# Patient Record
Sex: Female | Born: 1985 | Race: Black or African American | Hispanic: No | Marital: Single | Smoking: Never smoker
Health system: Southern US, Community
[De-identification: ages and names within clinical notes are randomized; demographics above are authoritative.]

## PROBLEM LIST (undated history)

## (undated) ENCOUNTER — Inpatient Hospital Stay (HOSPITAL_COMMUNITY): Payer: Self-pay

## (undated) DIAGNOSIS — E669 Obesity, unspecified: Secondary | ICD-10-CM

## (undated) DIAGNOSIS — I10 Essential (primary) hypertension: Secondary | ICD-10-CM

## (undated) HISTORY — PX: NO PAST SURGERIES: SHX2092

---

## 2009-07-18 ENCOUNTER — Ambulatory Visit: Payer: Self-pay | Admitting: Obstetrics and Gynecology

## 2009-07-18 ENCOUNTER — Inpatient Hospital Stay (HOSPITAL_COMMUNITY): Admission: AD | Admit: 2009-07-18 | Discharge: 2009-07-18 | Payer: Self-pay | Admitting: Obstetrics and Gynecology

## 2009-07-25 ENCOUNTER — Ambulatory Visit: Payer: Self-pay | Admitting: Obstetrics & Gynecology

## 2009-07-27 ENCOUNTER — Encounter: Payer: Self-pay | Admitting: Obstetrics and Gynecology

## 2009-07-27 ENCOUNTER — Ambulatory Visit: Payer: Self-pay | Admitting: Obstetrics & Gynecology

## 2009-07-27 LAB — CONVERTED CEMR LAB: Antibody Screen: NEGATIVE

## 2009-07-31 ENCOUNTER — Ambulatory Visit: Payer: Self-pay | Admitting: Obstetrics & Gynecology

## 2009-08-01 ENCOUNTER — Inpatient Hospital Stay (HOSPITAL_COMMUNITY): Admission: AD | Admit: 2009-08-01 | Discharge: 2009-08-03 | Payer: Self-pay | Admitting: Obstetrics & Gynecology

## 2009-08-01 ENCOUNTER — Ambulatory Visit: Payer: Self-pay | Admitting: Advanced Practice Midwife

## 2009-09-15 ENCOUNTER — Emergency Department (HOSPITAL_BASED_OUTPATIENT_CLINIC_OR_DEPARTMENT_OTHER): Admission: EM | Admit: 2009-09-15 | Discharge: 2009-09-15 | Payer: Self-pay | Admitting: Emergency Medicine

## 2010-11-11 ENCOUNTER — Encounter: Payer: Self-pay | Admitting: *Deleted

## 2011-01-24 LAB — CBC
HCT: 32.2 % — ABNORMAL LOW (ref 36.0–46.0)
Hemoglobin: 10.3 g/dL — ABNORMAL LOW (ref 12.0–15.0)
MCV: 79.4 fL (ref 78.0–100.0)
Platelets: 258 10*3/uL (ref 150–400)
RDW: 16.9 % — ABNORMAL HIGH (ref 11.5–15.5)
WBC: 6.6 10*3/uL (ref 4.0–10.5)

## 2011-01-24 LAB — RPR: RPR Ser Ql: NONREACTIVE

## 2011-01-24 LAB — POCT URINALYSIS DIP (DEVICE)
Glucose, UA: NEGATIVE mg/dL
Specific Gravity, Urine: 1.02 (ref 1.005–1.030)
Urobilinogen, UA: >=8 mg/dL (ref 0.0–1.0)

## 2011-01-25 LAB — DIFFERENTIAL
Basophils Absolute: 0.1 10*3/uL (ref 0.0–0.1)
Eosinophils Relative: 2 % (ref 0–5)
Lymphocytes Relative: 19 % (ref 12–46)
Monocytes Absolute: 0.5 10*3/uL (ref 0.1–1.0)
Monocytes Relative: 8 % (ref 3–12)
Neutro Abs: 4.2 10*3/uL (ref 1.7–7.7)

## 2011-01-25 LAB — CBC
HCT: 29.8 % — ABNORMAL LOW (ref 36.0–46.0)
Hemoglobin: 9.8 g/dL — ABNORMAL LOW (ref 12.0–15.0)
RBC: 3.73 MIL/uL — ABNORMAL LOW (ref 3.87–5.11)
RDW: 16.2 % — ABNORMAL HIGH (ref 11.5–15.5)

## 2011-01-25 LAB — URINE MICROSCOPIC-ADD ON

## 2011-01-25 LAB — URINALYSIS, ROUTINE W REFLEX MICROSCOPIC
Glucose, UA: NEGATIVE mg/dL
Hgb urine dipstick: NEGATIVE
Specific Gravity, Urine: 1.02 (ref 1.005–1.030)
pH: 7 (ref 5.0–8.0)

## 2011-01-25 LAB — RAPID URINE DRUG SCREEN, HOSP PERFORMED
Cocaine: NOT DETECTED
Opiates: NOT DETECTED
Tetrahydrocannabinol: NOT DETECTED

## 2011-01-25 LAB — RUBELLA SCREEN: Rubella: 23.1 IU/mL — ABNORMAL HIGH

## 2011-01-25 LAB — STREP B DNA PROBE: Strep Group B Ag: POSITIVE

## 2011-01-25 LAB — GC/CHLAMYDIA PROBE AMP, GENITAL
Chlamydia, DNA Probe: NEGATIVE
GC Probe Amp, Genital: NEGATIVE

## 2011-01-25 LAB — ABO/RH: ABO/RH(D): A POS

## 2011-01-25 LAB — GLUCOSE, CAPILLARY: Glucose-Capillary: 82 mg/dL (ref 70–99)

## 2011-01-25 LAB — RPR: RPR Ser Ql: NONREACTIVE

## 2011-01-25 LAB — SICKLE CELL SCREEN: Sickle Cell Screen: NEGATIVE

## 2011-01-25 LAB — WET PREP, GENITAL

## 2011-05-15 ENCOUNTER — Emergency Department (HOSPITAL_BASED_OUTPATIENT_CLINIC_OR_DEPARTMENT_OTHER)
Admission: EM | Admit: 2011-05-15 | Discharge: 2011-05-15 | Disposition: A | Discharge status: Home / Self Care | Payer: Self-pay | Attending: Emergency Medicine | Admitting: Emergency Medicine

## 2011-05-15 ENCOUNTER — Encounter: Payer: Self-pay | Admitting: *Deleted

## 2011-05-15 DIAGNOSIS — T63461A Toxic effect of venom of wasps, accidental (unintentional), initial encounter: Secondary | ICD-10-CM | POA: Insufficient documentation

## 2011-05-15 DIAGNOSIS — R21 Rash and other nonspecific skin eruption: Secondary | ICD-10-CM | POA: Insufficient documentation

## 2011-05-15 DIAGNOSIS — T6391XA Toxic effect of contact with unspecified venomous animal, accidental (unintentional), initial encounter: Secondary | ICD-10-CM | POA: Insufficient documentation

## 2011-05-15 DIAGNOSIS — T148 Other injury of unspecified body region: Secondary | ICD-10-CM

## 2011-05-15 MED ORDER — FAMOTIDINE 20 MG PO TABS
20.0000 mg | ORAL_TABLET | Freq: Once | ORAL | Status: AC
  Administered 2011-05-15: 20 mg via ORAL
  Filled 2011-05-15: qty 1

## 2011-05-15 MED ORDER — PREDNISONE 20 MG PO TABS
50.0000 mg | ORAL_TABLET | Freq: Once | ORAL | Status: AC
  Administered 2011-05-15: 50 mg via ORAL
  Filled 2011-05-15: qty 2

## 2011-05-15 MED ORDER — DIPHENHYDRAMINE HCL 25 MG PO CAPS: 25.0000 mg | ORAL_CAPSULE | Freq: Four times a day (QID) | ORAL | Status: AC | PRN

## 2011-05-15 MED ORDER — DIPHENHYDRAMINE HCL 25 MG PO CAPS
25.0000 mg | ORAL_CAPSULE | Freq: Once | ORAL | Status: AC
  Administered 2011-05-15: 25 mg via ORAL
  Filled 2011-05-15 (×2): qty 1

## 2011-05-15 NOTE — ED Notes (Signed)
Pt thinks she may have been bitten by a bug. Started as a little bump left of the umbilicus on Monday morning. Has now progressed to redness surrounding the umbilicus. Denies any meds PTA

## 2011-05-15 NOTE — ED Provider Notes (Signed)
History   occurred in area of her naval, did not see an insect but felt a sting, did not notice a retained stinger, now has increased redness and swelling, no SOB or lip swelling or chest tightness. No tick exposure or injury. No known allergens otherwise.   Chief Complaint  Patient presents with  . Insect Bite   Patient is a 25 y.o. female presenting with hypersensitivity reaction. The history is provided by the patient.  Hypersensitivity Reaction The primary symptoms are  rash. The primary symptoms do not include wheezing, shortness of breath, cough, abdominal pain, vomiting, palpitations or angioedema. The current episode started yesterday. The problem has been gradually worsening.  The rash is associated with itching.   The onset of the reaction was associated with insect bite/sting. Significant symptoms also include itching. Significant symptoms that are not present include eye redness or rhinorrhea.    History reviewed. No pertinent past medical history.  History reviewed. No pertinent past surgical history.  History reviewed. No pertinent family history.  History  Substance Use Topics  . Smoking status: Never Smoker   . Smokeless tobacco: Not on file  . Alcohol Use: No    OB History    Grav Para Term Preterm Abortions TAB SAB Ect Mult Living                  Review of Systems  Constitutional: Negative for fever and chills.  HENT: Negative for rhinorrhea, neck pain and neck stiffness.   Eyes: Negative for pain and redness.  Respiratory: Negative for cough, shortness of breath and wheezing.   Cardiovascular: Negative for chest pain and palpitations.  Gastrointestinal: Negative for vomiting and abdominal pain.  Genitourinary: Negative for dysuria.  Musculoskeletal: Negative for back pain.  Skin: Positive for itching and rash.  Neurological: Negative for headaches.  All other systems reviewed and are negative.    Physical Exam  BP 133/84  Pulse 87  Temp(Src)  97.7 F (36.5 C) (Oral)  Resp 18  Ht 5\' 6"  (1.676 m)  Wt 320 lb (145.151 kg)  BMI 51.65 kg/m2  SpO2 100%  Physical Exam  Constitutional: She is oriented to person, place, and time. She appears well-developed and well-nourished.  HENT:  Head: Normocephalic and atraumatic.  Eyes: Conjunctivae and EOM are normal. Pupils are equal, round, and reactive to light.  Neck: Trachea normal. Neck supple. No thyromegaly present.  Cardiovascular: Normal rate, regular rhythm, S1 normal, S2 normal and normal pulses.     No systolic murmur is present   No diastolic murmur is present  Pulses:      Radial pulses are 2+ on the right side, and 2+ on the left side.  Pulmonary/Chest: Effort normal and breath sounds normal. She has no wheezes. She has no rhonchi. She has no rales. She exhibits no tenderness.  Abdominal: Soft. Normal appearance and bowel sounds are normal. There is no tenderness. There is no CVA tenderness and negative Murphy's sign.  Musculoskeletal:       BLE:s Calves nontender, no cords or erythema, negative Homans sign  Neurological: She is alert and oriented to person, place, and time. She has normal strength. No cranial nerve deficit or sensory deficit. GCS eye subscore is 4. GCS verbal subscore is 5. GCS motor subscore is 6.  Skin: Skin is warm and dry. Rash noted. She is not diaphoretic.       periumbilcal mostly L sided erythema, edema and inc warmth, blanching rash c/w insect envenomation and localized reaction  Psychiatric: Her speech is normal.       Cooperative and appropriate    ED Course  Procedures  MDM Exam c/w reported insect envenomation, no symptoms of anaphylaxis, no stridor or wheezing, uvula midline. Treated benadryl, PO pred and pecid here with strict return precautions verbalized as understood.       Sunnie Nielsen, MD 05/15/11 [phone]

## 2012-04-28 ENCOUNTER — Emergency Department (HOSPITAL_COMMUNITY)
Admission: EM | Admit: 2012-04-28 | Discharge: 2012-04-28 | Discharge status: Against Medical Advice | Payer: Medicaid Other | Attending: Emergency Medicine | Admitting: Emergency Medicine

## 2012-04-28 ENCOUNTER — Encounter (HOSPITAL_COMMUNITY): Payer: Self-pay | Admitting: *Deleted

## 2012-04-28 DIAGNOSIS — R3989 Other symptoms and signs involving the genitourinary system: Secondary | ICD-10-CM | POA: Insufficient documentation

## 2012-04-28 DIAGNOSIS — R109 Unspecified abdominal pain: Secondary | ICD-10-CM | POA: Insufficient documentation

## 2012-04-28 LAB — URINE MICROSCOPIC-ADD ON

## 2012-04-28 LAB — CBC WITH DIFFERENTIAL/PLATELET
Basophils Absolute: 0 10*3/uL (ref 0.0–0.1)
Basophils Relative: 0 % (ref 0–1)
Eosinophils Absolute: 0.1 10*3/uL (ref 0.0–0.7)
Hemoglobin: 12.2 g/dL (ref 12.0–15.0)
MCH: 25.9 pg — ABNORMAL LOW (ref 26.0–34.0)
MCHC: 32.3 g/dL (ref 30.0–36.0)
Monocytes Relative: 5 % (ref 3–12)
Neutro Abs: 4.6 10*3/uL (ref 1.7–7.7)
Neutrophils Relative %: 72 % (ref 43–77)
Platelets: 305 10*3/uL (ref 150–400)
RDW: 14.2 % (ref 11.5–15.5)

## 2012-04-28 LAB — URINALYSIS, ROUTINE W REFLEX MICROSCOPIC
Ketones, ur: NEGATIVE mg/dL
Nitrite: NEGATIVE
Protein, ur: NEGATIVE mg/dL
Urobilinogen, UA: 1 mg/dL (ref 0.0–1.0)

## 2012-04-28 LAB — BASIC METABOLIC PANEL
BUN: 9 mg/dL (ref 6–23)
Creatinine, Ser: 0.73 mg/dL (ref 0.50–1.10)
GFR calc Af Amer: >90 mL/min (ref >90)
GFR calc non Af Amer: >90 mL/min (ref >90)
Potassium: 3.6 mEq/L (ref 3.5–5.1)

## 2012-04-28 LAB — PREGNANCY, URINE: Preg Test, Ur: NEGATIVE

## 2012-04-28 NOTE — ED Notes (Signed)
Pt c/o right flank pain x 2 days; "sinus infection" also; no dysuria

## 2012-04-28 NOTE — ED Provider Notes (Signed)
History     CSN: [id number]  Arrival date & time 04/28/12  7829   First MD Initiated Contact with Patient 04/28/12 2151      Chief Complaint  Patient presents with  . Flank Pain    (Consider location/radiation/quality/duration/timing/severity/associated sxs/prior treatment) HPI Comments: R flank pain without dysuria also concerned she may be pregnant   Patient is a 26 y.o. female presenting with flank pain. The history is provided by the patient.  Flank Pain This is a new problem. The current episode started yesterday. The problem has been unchanged. Associated symptoms include urinary symptoms. Pertinent negatives include no abdominal pain, fatigue, fever, nausea, rash or vomiting. She has tried nothing for the symptoms.    History reviewed. No pertinent past medical history.  History reviewed. No pertinent past surgical history.  No family history on file.  History  Substance Use Topics  . Smoking status: Never Smoker   . Smokeless tobacco: Not on file  . Alcohol Use: No    OB History    Grav Para Term Preterm Abortions TAB SAB Ect Mult Living                  Review of Systems  Constitutional: Negative for fever and fatigue.  Gastrointestinal: Negative for nausea, vomiting and abdominal pain.  Genitourinary: Positive for flank pain. Negative for dysuria, vaginal bleeding, vaginal discharge and menstrual problem.  Skin: Negative for rash.    Allergies  Review of patient's allergies indicates no known allergies.  Home Medications  No current outpatient prescriptions on file.  BP 123/56  Pulse 100  Temp 98.5 F (36.9 C)  Resp 20  SpO2 100%  LMP 04/14/2012  Physical Exam  Constitutional: She appears well-developed.  HENT:  Head: Normocephalic.  Eyes: Pupils are equal, round, and reactive to light.  Neck: Normal range of motion.  Cardiovascular: Normal rate.   Pulmonary/Chest: Effort normal.  Abdominal: She exhibits no distension. There is no  tenderness.  Musculoskeletal: Normal range of motion.  Neurological: She is alert.  Skin: No rash noted.    ED Course  Procedures (including critical care time)  Labs Reviewed  URINALYSIS, ROUTINE W REFLEX MICROSCOPIC - Abnormal; Notable for the following:    APPearance CLOUDY (*)     Leukocytes, UA MODERATE (*)     All other components within normal limits  CBC WITH DIFFERENTIAL - Abnormal; Notable for the following:    MCH 25.9 (*)     All other components within normal limits  URINE MICROSCOPIC-ADD ON - Abnormal; Notable for the following:    Squamous Epithelial / LPF MANY (*)     Bacteria, UA MANY (*)     All other components within normal limits  PREGNANCY, URINE  BASIC METABOLIC PANEL   No results found.   1. Flank pain       MDM  + leukocytes and Bacteria without nitrates  Denies vaginal discharge  Discussed need for pelvic exam after review of urine sample patient refuses  Informed her of negative pregnancy test Patient left department before she could be discharged         Arman Filter, NP 04/28/12 2231  Arman Filter, NP 04/28/12 2231

## 2012-04-29 NOTE — ED Provider Notes (Signed)
Medical screening examination/treatment/procedure(s) were performed by non-physician practitioner and as supervising physician I was immediately available for consultation/collaboration.   Betsaida [place], MD 04/29/12 0020 

## 2013-01-16 ENCOUNTER — Inpatient Hospital Stay (HOSPITAL_COMMUNITY)
Admission: AD | Admit: 2013-01-16 | Discharge: 2013-01-16 | Disposition: A | Discharge status: Home / Self Care | Payer: Self-pay | Source: Ambulatory Visit | Attending: Obstetrics & Gynecology | Admitting: Obstetrics & Gynecology

## 2013-01-16 ENCOUNTER — Encounter (HOSPITAL_COMMUNITY): Payer: Self-pay

## 2013-01-16 ENCOUNTER — Inpatient Hospital Stay (HOSPITAL_COMMUNITY): Payer: Self-pay

## 2013-01-16 DIAGNOSIS — O99891 Other specified diseases and conditions complicating pregnancy: Secondary | ICD-10-CM | POA: Insufficient documentation

## 2013-01-16 DIAGNOSIS — Z1389 Encounter for screening for other disorder: Secondary | ICD-10-CM

## 2013-01-16 DIAGNOSIS — Z3490 Encounter for supervision of normal pregnancy, unspecified, unspecified trimester: Secondary | ICD-10-CM

## 2013-01-16 DIAGNOSIS — R109 Unspecified abdominal pain: Secondary | ICD-10-CM | POA: Insufficient documentation

## 2013-01-16 LAB — URINALYSIS, ROUTINE W REFLEX MICROSCOPIC
Bilirubin Urine: NEGATIVE
Glucose, UA: NEGATIVE mg/dL
Hgb urine dipstick: NEGATIVE
Specific Gravity, Urine: 1.015 (ref 1.005–1.030)

## 2013-01-16 LAB — CBC
HCT: 35 % — ABNORMAL LOW (ref 36.0–46.0)
Hemoglobin: 11.2 g/dL — ABNORMAL LOW (ref 12.0–15.0)
MCH: 26.4 pg (ref 26.0–34.0)
MCV: 82.5 fL (ref 78.0–100.0)
RBC: 4.24 MIL/uL (ref 3.87–5.11)
WBC: 7.4 10*3/uL (ref 4.0–10.5)

## 2013-01-16 MED ORDER — PRENATAL VITAMINS PLUS 27-1 MG PO TABS: 1.0000 | ORAL_TABLET | Freq: Every day | ORAL | Status: DC

## 2013-01-16 NOTE — MAU Note (Signed)
LMP 12/05/12. Positive HPT. Lower abdominal cramping x 3 days. Denies vaginal bleeding or vaginal discharge.

## 2013-01-16 NOTE — MAU Provider Note (Signed)
History     CSN: [id number]  Arrival date and time: 01/16/13 2100   First Provider Initiated Contact with Patient 01/16/13 2309      No chief complaint on file.  HPI Brooke Henderson 27 y.o. [redacted]w[redacted]d   Comes to MAU with lower abdominal pain.  No bleeding.  No discharge.  No nausea or vomiting.  OB History   Grav Para Term Preterm Abortions TAB SAB Ect Mult Living   5 3 3  0 0 0 0 0 0 3      No past medical history on file.  No past surgical history on file.  No family history on file.  History  Substance Use Topics  . Smoking status: Never Smoker   . Smokeless tobacco: Not on file  . Alcohol Use: No    Allergies: No Known Allergies  No prescriptions prior to admission    Review of Systems  Constitutional: Negative for fever.  Gastrointestinal: Positive for abdominal pain. Negative for nausea, vomiting, diarrhea and constipation.  Genitourinary:       No vaginal discharge. No vaginal bleeding. No dysuria.   Physical Exam   Blood pressure 133/96, pulse 82, temperature 98.3 F (36.8 C), temperature source Oral, resp. rate 18, height 5\' 6"  (1.676 m), weight 339 lb 3.2 oz (153.86 kg), last menstrual period 12/05/2012, SpO2 100.00%.  Physical Exam  Nursing note and vitals reviewed. Constitutional: She is oriented to person, place, and time. She appears well-developed and well-nourished. No distress.  Morbid obesity  HENT:  Head: Normocephalic.  Eyes: EOM are normal.  Neck: Neck supple.  Musculoskeletal: Normal range of motion.  Neurological: She is alert and oriented to person, place, and time.  Skin: Skin is warm and dry.  Psychiatric: She has a normal mood and affect.    MAU Course  Procedures Results for orders placed during the hospital encounter of 01/16/13 (from the past 24 hour(s))  URINALYSIS, ROUTINE W REFLEX MICROSCOPIC     Status: None   Collection Time    01/16/13  9:24 PM      Result Value Range   Color, Urine YELLOW  YELLOW   APPearance CLEAR   CLEAR   Specific Gravity, Urine 1.015  1.005 - 1.030   pH 8.0  5.0 - 8.0   Glucose, UA NEGATIVE  NEGATIVE mg/dL   Hgb urine dipstick NEGATIVE  NEGATIVE   Bilirubin Urine NEGATIVE  NEGATIVE   Ketones, ur NEGATIVE  NEGATIVE mg/dL   Protein, ur NEGATIVE  NEGATIVE mg/dL   Urobilinogen, UA 0.2  0.0 - 1.0 mg/dL   Nitrite NEGATIVE  NEGATIVE   Leukocytes, UA NEGATIVE  NEGATIVE  POCT PREGNANCY, URINE     Status: Abnormal   Collection Time    01/16/13  9:31 PM      Result Value Range   Preg Test, Ur POSITIVE (*) NEGATIVE  CBC     Status: Abnormal   Collection Time    01/16/13  9:47 PM      Result Value Range   WBC 7.4  4.0 - 10.5 K/uL   RBC 4.24  3.87 - 5.11 MIL/uL   Hemoglobin 11.2 (*) 12.0 - 15.0 g/dL   HCT 78.2 (*) 95.6 - 21.3 %   MCV 82.5  78.0 - 100.0 fL   MCH 26.4  26.0 - 34.0 pg   MCHC 32.0  30.0 - 36.0 g/dL   RDW 08.6  57.8 - 46.9 %   Platelets 248  150 - 400 K/uL  HCG,  QUANTITATIVE, PREGNANCY     Status: Abnormal   Collection Time    01/16/13  9:47 PM      Result Value Range   hCG, Beta Chain, Quant, S 5142 (*) <5 mIU/mL   MDM Ultrasound worksheet shows IUGS with yolk sac at 5w 4d with EDC of 09-14-13.  Assessment and Plan  IUP  Plan  Begin prenatal care. Plans to call clinic downstairs.  Phone number given.  No smoking, no drugs, no alcohol. rx for prenatal vitamins given Verification of pregnancy given.  Brooke Henderson 01/16/2013, 11:14 PM

## 2013-01-17 NOTE — MAU Provider Note (Signed)
Attestation of Attending Supervision of Advanced Practitioner (PA/CNM/NP): Evaluation and management procedures were performed by the Advanced Practitioner under my supervision and collaboration.  I have reviewed the Advanced Practitioner's note and chart, and I agree with the management and plan.  Shaquela Weichert, MD, FACOG Attending Obstetrician & Gynecologist Faculty Practice, [facility]   

## 2013-01-28 ENCOUNTER — Inpatient Hospital Stay (HOSPITAL_COMMUNITY)
Admission: AD | Admit: 2013-01-28 | Discharge: 2013-01-29 | Disposition: A | Discharge status: Home / Self Care | Payer: Medicaid Other | Source: Ambulatory Visit | Attending: Obstetrics & Gynecology | Admitting: Obstetrics & Gynecology

## 2013-01-28 ENCOUNTER — Encounter (HOSPITAL_COMMUNITY): Payer: Self-pay | Admitting: *Deleted

## 2013-01-28 DIAGNOSIS — O99891 Other specified diseases and conditions complicating pregnancy: Secondary | ICD-10-CM | POA: Insufficient documentation

## 2013-01-28 DIAGNOSIS — O209 Hemorrhage in early pregnancy, unspecified: Secondary | ICD-10-CM | POA: Insufficient documentation

## 2013-01-28 DIAGNOSIS — O26899 Other specified pregnancy related conditions, unspecified trimester: Secondary | ICD-10-CM

## 2013-01-28 DIAGNOSIS — O210 Mild hyperemesis gravidarum: Secondary | ICD-10-CM | POA: Insufficient documentation

## 2013-01-28 DIAGNOSIS — R109 Unspecified abdominal pain: Secondary | ICD-10-CM | POA: Insufficient documentation

## 2013-01-28 DIAGNOSIS — O219 Vomiting of pregnancy, unspecified: Secondary | ICD-10-CM

## 2013-01-28 HISTORY — DX: Obesity, unspecified: E66.9

## 2013-01-28 LAB — URINALYSIS, ROUTINE W REFLEX MICROSCOPIC
Bilirubin Urine: NEGATIVE
Glucose, UA: NEGATIVE mg/dL
Hgb urine dipstick: NEGATIVE
Specific Gravity, Urine: 1.025 (ref 1.005–1.030)
Urobilinogen, UA: 2 mg/dL — ABNORMAL HIGH (ref 0.0–1.0)
pH: 7 (ref 5.0–8.0)

## 2013-01-28 NOTE — Progress Notes (Signed)
Pt states pain goes from 1 side of her abdomen to the other.

## 2013-01-28 NOTE — MAU Provider Note (Signed)
History     CSN: [id number]  Arrival date and time: 01/28/13 2319   First Provider Initiated Contact with Patient 01/28/13 2352    Chief Complaint  Patient presents with  . Abdominal Pain   HPI Brooke Henderson is a 27 y.o. [redacted]w[redacted]d who presents with abdominal pain that started this morning started in the right upper quadrant and went down to across the lower abdomen. 10/10 sharp, stabbing pain Alleviated by vomiting. Has not tried any medications. No bleeding, discharge, or leakage of fluids.  Getting [facility] appointment at [facility]: 4/13  Last sexual activity 2 weeks ago with boyfriend.    OB History   Grav Para Term Preterm Abortions TAB SAB Ect Mult Living   4 3 3  0 0 0 0 0 0 3      Past Medical History  Diagnosis Date  . Obese     Past Surgical History  Procedure Laterality Date  . No past surgeries      Family History  Problem Relation Age of Onset  . Seizures Son   . Hypertension Maternal Aunt   . Hypertension Paternal Uncle   . Diabetes Maternal Grandmother   . Diabetes Maternal Grandfather   . Diabetes Paternal Grandmother   . Diabetes Paternal Grandfather     History  Substance Use Topics  . Smoking status: Never Smoker   . Smokeless tobacco: Not on file  . Alcohol Use: No    Allergies: No Known Allergies  Prescriptions prior to admission  Medication Sig Dispense Refill  . Prenatal Vit-Fe Fumarate-FA (PRENATAL VITAMINS PLUS) 27-1 MG TABS Take 1 tablet by mouth daily. Generic is OK - Prenatal vitamin with 1 mg Folic acid  30 tablet  0    Review of Systems  Constitutional: Negative for fever and chills.  Respiratory: Positive for cough (getting over a cold) and sputum production. Negative for hemoptysis, shortness of breath and wheezing.   Cardiovascular: Negative.   Gastrointestinal: Positive for nausea (for entire length of pregnancy), vomiting (every other day usually once a day today 5 times. ) and abdominal pain. Negative for heartburn, diarrhea,  constipation, blood in stool and melena.  Genitourinary: Positive for dysuria. Negative for urgency, frequency, hematuria and flank pain.  Neurological: Negative for headaches.   Physical Exam   Blood pressure 132/81, pulse 77, temperature 98.5 F (36.9 C), temperature source Oral, resp. rate 18, height 5\' 6"  (1.676 m), weight 151.592 kg (334 lb 3.2 oz), last menstrual period 12/05/2012, SpO2 100.00%.  Physical Exam  Constitutional: She appears well-developed and well-nourished.  HENT:  Head: Normocephalic and atraumatic.  Eyes: EOM are normal.  Cardiovascular: Normal rate, regular rhythm, normal heart sounds and intact distal pulses.  Exam reveals no gallop and no friction rub.   No murmur heard. Respiratory: Effort normal and breath sounds normal. No respiratory distress. She has no wheezes. She has no rales. She exhibits no tenderness.  GI: Soft. Bowel sounds are normal. She exhibits no distension and no mass. There is tenderness (mild worse in the RLQ and LUQ) in the right upper quadrant, right lower quadrant and left upper quadrant. There is no rebound, no guarding and no CVA tenderness.  Skin: Skin is warm and dry.   US Ob Transvaginal  01/29/2013  *RADIOLOGY REPORT*  Clinical Data: Pelvic pain.  Follow-up study to assess for presence of embryo.  TRANSVAGINAL OBSTETRIC US  Technique:  Transvaginal ultrasound was performed for complete evaluation of the gestation as well as the maternal uterus, adnexal  regions, and pelvic cul-de-sac.  Comparison:  Pelvic ultrasound performed 01/16/2013  Intrauterine gestational sac: Visualized; normal in shape. Yolk sac: Yes Embryo: Yes Cardiac Activity: Yes Heart Rate: 135  CRL: 1.16 cm           7   w  3   d             [place] EDC: 09/14/2013  Subchorionic hemorrhage: A small amount of subchorionic hemorrhage is noted.  Maternal uterus/adnexae: The uterus otherwise unremarkable in appearance.  The right ovary is within normal limits, measuring 2.4 x 1.6 x 1.5  cm.  The left ovary is not visualized, but was noted to be normal in appearance on the recent prior study.  No suspicious adnexal masses are seen; there is no evidence for ovarian torsion.  No free fluid is seen in the pelvic cul-de-sac.  IMPRESSION:  1.  Single live intrauterine pregnancy noted, with a crown-rump length of 1.2 cm, corresponding to a gestational age of [redacted] weeks 3 days.  This matches the gestational age of [redacted] weeks 6 days by LMP, reflecting an estimated date of delivery of September 11, 2013. 2.  Small amount of subchorionic hemorrhage noted.   Original Report Authenticated By: Tonia Ghent, M.D.    Urinalysis  Results for orders placed during the hospital encounter of 01/28/13 (from the past 24 hour(s))  URINALYSIS, ROUTINE W REFLEX MICROSCOPIC     Status: Abnormal   Collection Time    01/28/13 11:27 PM      Result Value Range   Color, Urine YELLOW  YELLOW   APPearance HAZY (*) CLEAR   Specific Gravity, Urine 1.025  1.005 - 1.030   pH 7.0  5.0 - 8.0   Glucose, UA NEGATIVE  NEGATIVE mg/dL   Hgb urine dipstick NEGATIVE  NEGATIVE   Bilirubin Urine NEGATIVE  NEGATIVE   Ketones, ur NEGATIVE  NEGATIVE mg/dL   Protein, ur NEGATIVE  NEGATIVE mg/dL   Urobilinogen, UA 2.0 (*) 0.0 - 1.0 mg/dL   Nitrite NEGATIVE  NEGATIVE   Leukocytes, UA NEGATIVE  NEGATIVE  CBC     Status: Abnormal   Collection Time    01/29/13 12:20 AM      Result Value Range   WBC 5.8  4.0 - 10.5 K/uL   RBC 4.18  3.87 - 5.11 MIL/uL   Hemoglobin 11.0 (*) 12.0 - 15.0 g/dL   HCT 14.7 (*) 82.9 - 56.2 %   MCV 82.1  78.0 - 100.0 fL   MCH 26.3  26.0 - 34.0 pg   MCHC 32.1  30.0 - 36.0 g/dL   RDW 13.0  86.5 - 78.4 %   Platelets 230  150 - 400 K/uL         MAU Course  Procedures  MDM Sent for [place], offered pelvic exam declined states she will get a pelvic at her next clinic appt.   Assessment and Plan  1. Abdominal pain: US showed single live intrauterine pregnancy. Small amount of subchorionic  hemorrhage.Refused pelvic exam will Will f/u in women's clinic for [facility]. Refused any pain meds.   Brooke Henderson 01/28/2013, 11:54 PM   I was present for the exam and agree with above. Pt in NAD at time of discharge.   Gibson, CNM 01/29/2013 4:02 AM

## 2013-01-28 NOTE — MAU Note (Signed)
Pt states she started having pain earlier this morning,0640.

## 2013-01-28 NOTE — MAU Note (Signed)
Lower abdominal pain since this morning, sharp & constant. Denies vaginal bleeding or discharge. Denies urinary complaints. Still having morning sickness.

## 2013-01-29 ENCOUNTER — Inpatient Hospital Stay (HOSPITAL_COMMUNITY): Payer: Self-pay

## 2013-01-29 ENCOUNTER — Encounter (HOSPITAL_COMMUNITY): Payer: Self-pay | Admitting: Advanced Practice Midwife

## 2013-01-29 DIAGNOSIS — O219 Vomiting of pregnancy, unspecified: Secondary | ICD-10-CM

## 2013-01-29 LAB — CBC
Hemoglobin: 11 g/dL — ABNORMAL LOW (ref 12.0–15.0)
MCV: 82.1 fL (ref 78.0–100.0)
Platelets: 230 10*3/uL (ref 150–400)
RBC: 4.18 MIL/uL (ref 3.87–5.11)
WBC: 5.8 10*3/uL (ref 4.0–10.5)

## 2013-01-29 MED ORDER — PROMETHAZINE HCL 25 MG PO TABS: 25.0000 mg | ORAL_TABLET | Freq: Four times a day (QID) | ORAL | Status: DC | PRN

## 2013-01-29 MED ORDER — FOLIC ACID 400 MCG PO TABS: 800.0000 ug | ORAL_TABLET | Freq: Every day | ORAL | Status: DC

## 2013-01-29 NOTE — Progress Notes (Signed)
Patient rates her pain a 10.Pt continues to smile and states"I don't like taking a lot medication"

## 2013-03-02 ENCOUNTER — Other Ambulatory Visit: Payer: Medicaid Other

## 2013-03-09 ENCOUNTER — Encounter: Payer: Medicaid Other | Admitting: Obstetrics & Gynecology

## 2013-05-24 IMAGING — US US OB COMP LESS 14 WK
1 series · 14 of 28 positions shown · non-contrast
Comparison: 07/18/2009.

CLINICAL DATA: Early pregnancy.  Pelvic pain.

OBSTETRIC <14 WK US AND TRANSVAGINAL OB US
TECHNIQUE: Both transabdominal and transvaginal ultrasound
examinations were performed for complete evaluation of the
gestation as well as the maternal uterus, adnexal regions, and
pelvic cul-de-sac.  Transvaginal technique was performed to assess
early pregnancy.

[Series 1: us ob comp less 14 wks · 48 acquisitions, 14 frames shown]
[im 2/48]
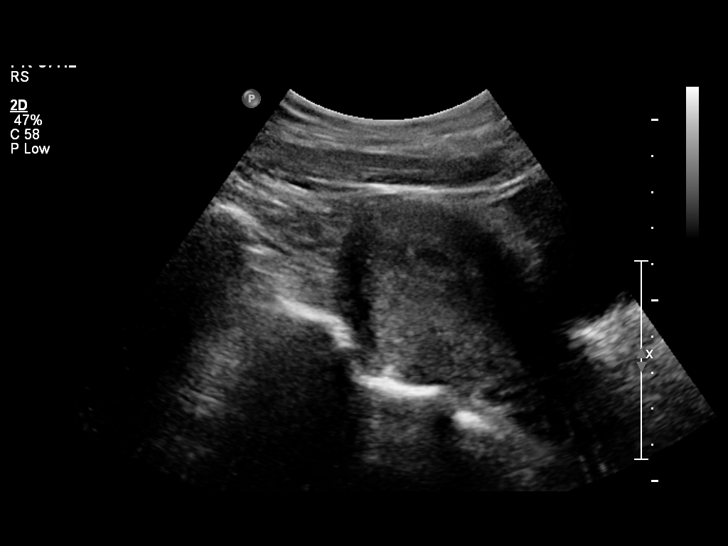
[im 6/48]
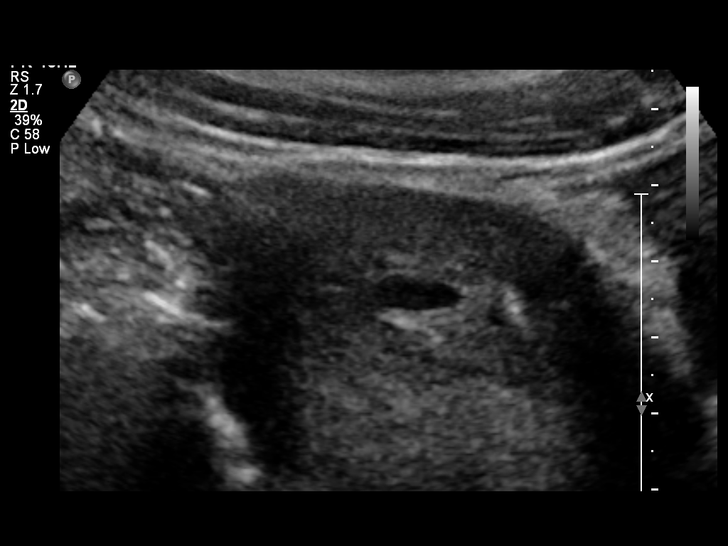
[im 9/48]
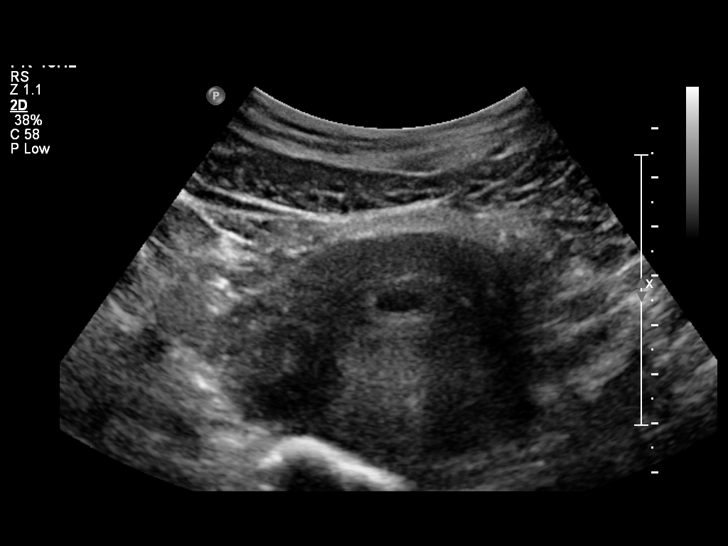
[im 13/48]
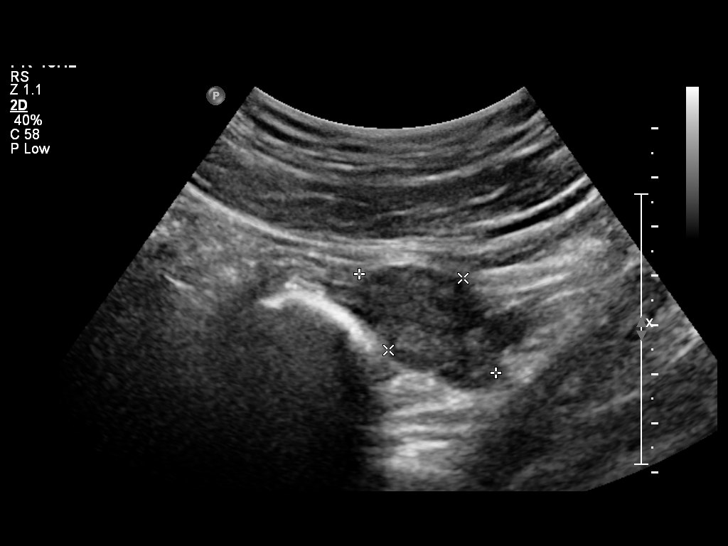
[im 16/48]
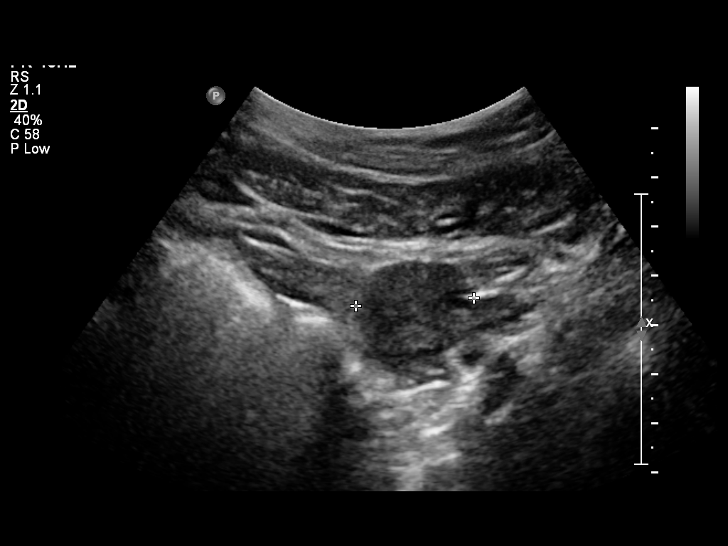
[im 20/48]
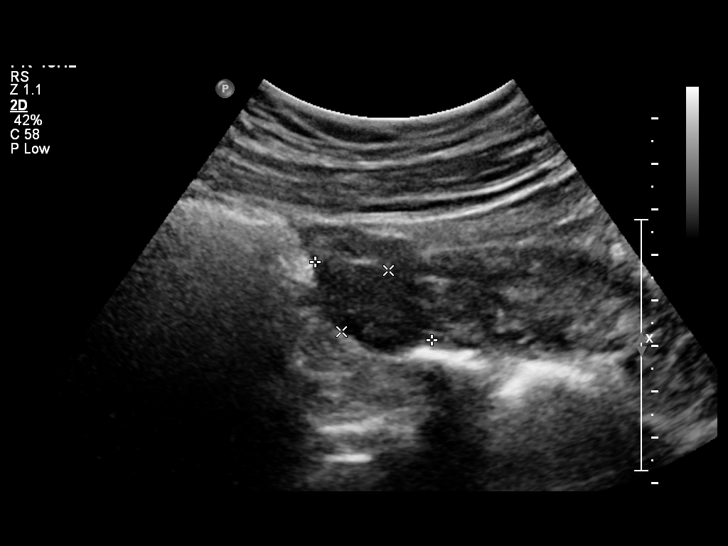
[im 23/48]
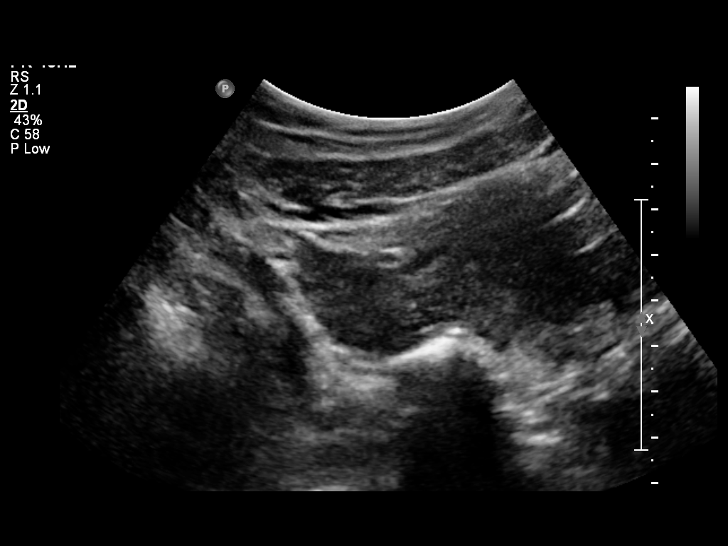
[im 27/48]
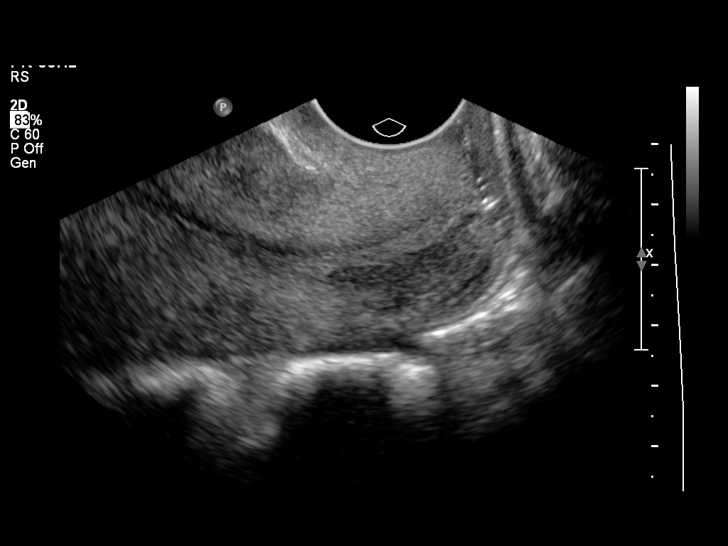
[im 30/48]
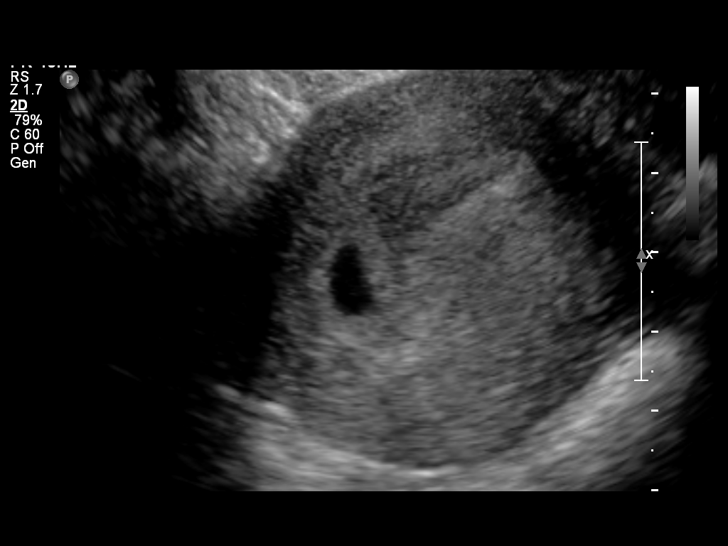
[im 34/48]
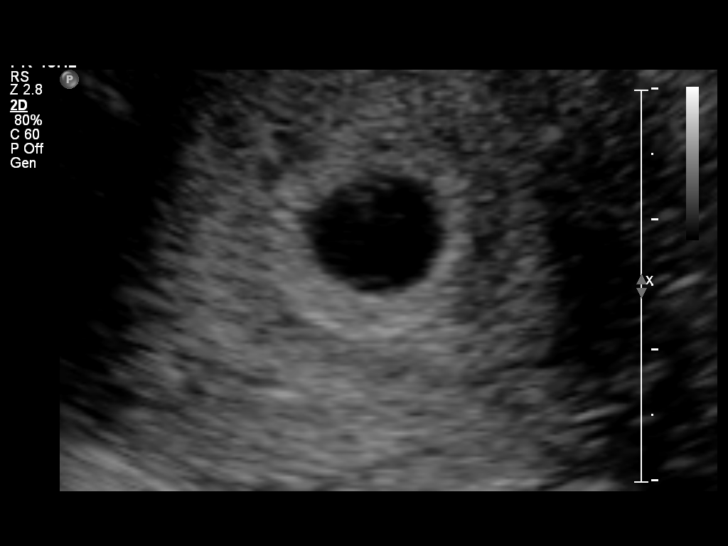
[im 37/48]
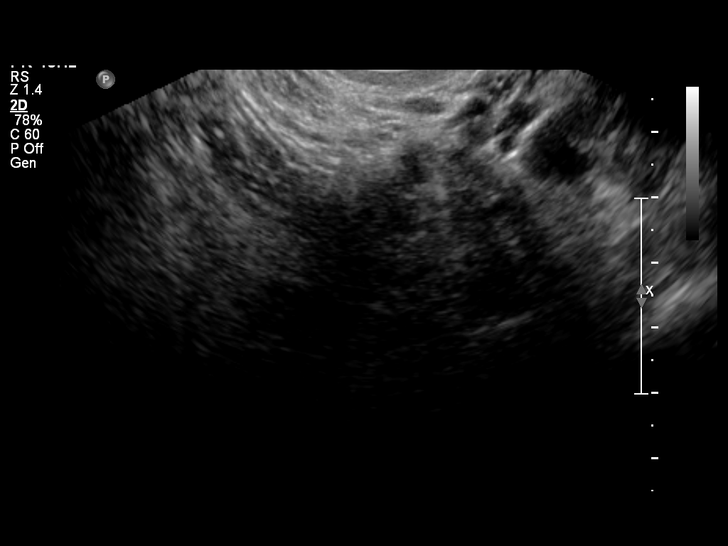
[im 41/48]
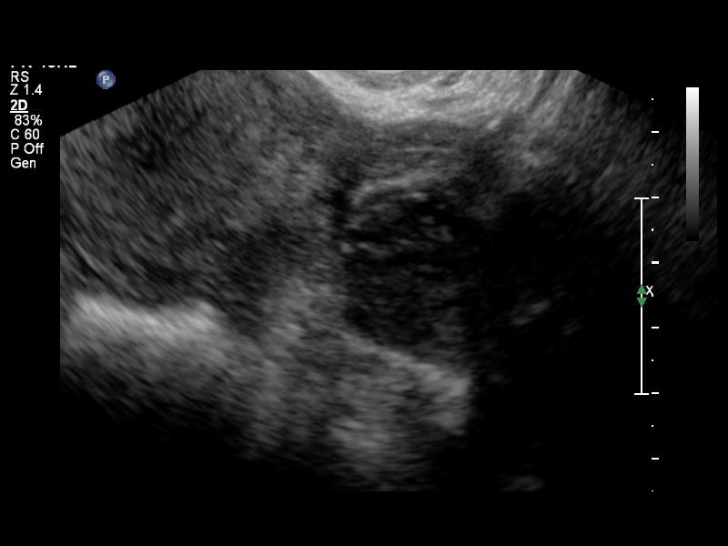
[im 44/48]
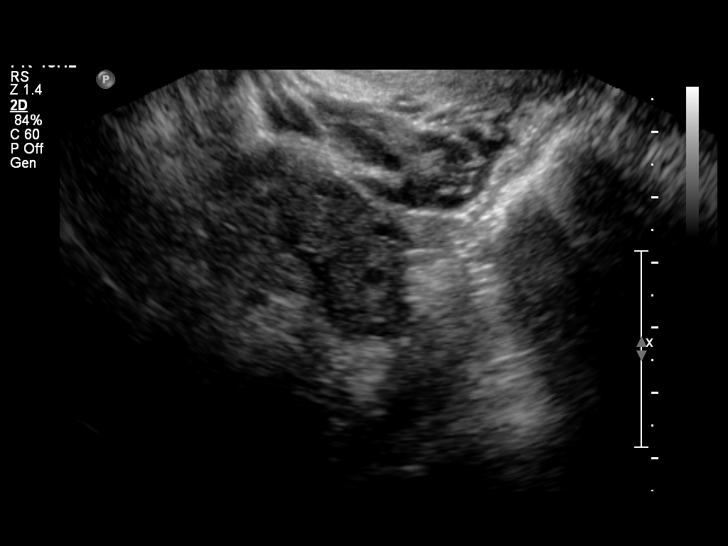
[im 48/48]
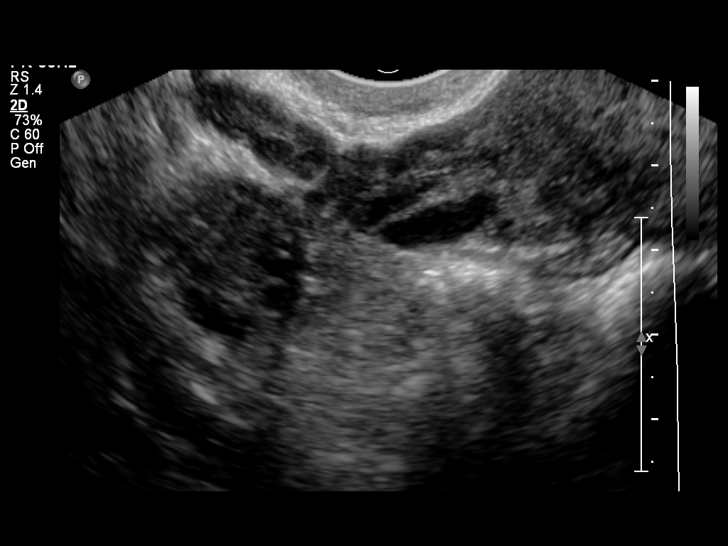

[14 of 28 positions shown; findings below may reference images not displayed]

Intrauterine gestational sac:  A single round gestational sac in
the fundal portion of the endometrial canal.
Yolk sac: Present.
Embryo: None.
Cardiac Activity: None.

MSD: 9 mm  5 w 4 d            US EDC: 09/14/2013.

Maternal uterus/adnexae:
No evidence of subchorionic hemorrhage.  Ovaries are normal in
echotexture and appearance bilaterally.  No significant free fluid
the cul-de-sac.
IMPRESSION: 1.  There is a small gestational sac with a normal-appearing yolk
sac in the fundal portion of the endometrial cavity, likely to
reflect an early IUP.  At this time, no embryo is identified.
Appropriate follow-up is recommended.
2.  No acute findings.

## 2013-11-21 ENCOUNTER — Encounter (HOSPITAL_COMMUNITY): Payer: Self-pay | Admitting: *Deleted

## 2014-01-19 ENCOUNTER — Encounter (HOSPITAL_COMMUNITY): Payer: Self-pay

## 2014-01-19 ENCOUNTER — Inpatient Hospital Stay (HOSPITAL_COMMUNITY)
Admission: AD | Admit: 2014-01-19 | Discharge: 2014-01-19 | Disposition: A | Discharge status: Home / Self Care | Payer: Medicaid Other | Source: Ambulatory Visit | Attending: Obstetrics & Gynecology | Admitting: Obstetrics & Gynecology

## 2014-01-19 DIAGNOSIS — Z3201 Encounter for pregnancy test, result positive: Secondary | ICD-10-CM | POA: Insufficient documentation

## 2014-01-19 DIAGNOSIS — R110 Nausea: Secondary | ICD-10-CM

## 2014-01-19 DIAGNOSIS — O26899 Other specified pregnancy related conditions, unspecified trimester: Secondary | ICD-10-CM

## 2014-01-19 DIAGNOSIS — R109 Unspecified abdominal pain: Secondary | ICD-10-CM | POA: Insufficient documentation

## 2014-01-19 DIAGNOSIS — R112 Nausea with vomiting, unspecified: Secondary | ICD-10-CM | POA: Insufficient documentation

## 2014-01-19 LAB — WET PREP, GENITAL
Clue Cells Wet Prep HPF POC: NONE SEEN
Trich, Wet Prep: NONE SEEN
Yeast Wet Prep HPF POC: NONE SEEN

## 2014-01-19 LAB — CBC
HCT: 36.2 % (ref 36.0–46.0)
Hemoglobin: 11.7 g/dL — ABNORMAL LOW (ref 12.0–15.0)
MCH: 26.8 pg (ref 26.0–34.0)
MCHC: 32.3 g/dL (ref 30.0–36.0)
MCV: 82.8 fL (ref 78.0–100.0)
PLATELETS: 241 10*3/uL (ref 150–400)
RBC: 4.37 MIL/uL (ref 3.87–5.11)
RDW: 13.9 % (ref 11.5–15.5)
WBC: 6.5 10*3/uL (ref 4.0–10.5)

## 2014-01-19 LAB — URINALYSIS, ROUTINE W REFLEX MICROSCOPIC
Bilirubin Urine: NEGATIVE
Glucose, UA: NEGATIVE mg/dL
HGB URINE DIPSTICK: NEGATIVE
KETONES UR: NEGATIVE mg/dL
Leukocytes, UA: NEGATIVE
Nitrite: NEGATIVE
PROTEIN: NEGATIVE mg/dL
Specific Gravity, Urine: 1.025 (ref 1.005–1.030)
UROBILINOGEN UA: 0.2 mg/dL (ref 0.0–1.0)
pH: 6 (ref 5.0–8.0)

## 2014-01-19 LAB — POCT PREGNANCY, URINE: PREG TEST UR: POSITIVE — AB

## 2014-01-19 LAB — HIV ANTIBODY (ROUTINE TESTING W REFLEX): HIV: NONREACTIVE

## 2014-01-19 MED ORDER — METOCLOPRAMIDE HCL 10 MG PO TABS
10.0000 mg | ORAL_TABLET | Freq: Once | ORAL | Status: AC
  Administered 2014-01-19: 10 mg via ORAL
  Filled 2014-01-19: qty 1

## 2014-01-19 MED ORDER — PROMETHAZINE HCL 25 MG PO TABS: 25.0000 mg | ORAL_TABLET | Freq: Four times a day (QID) | ORAL | Status: DC | PRN

## 2014-01-19 NOTE — MAU Provider Note (Signed)
History     CSN: [id number]  Arrival date and time: 01/19/14 0944   First Provider Initiated Contact with Patient 01/19/14 1033      Chief Complaint  Patient presents with  . Possible Pregnancy  . Abdominal Cramping   HPI Comments: Brooke Henderson 28 y.o. [id number] presents to MAU for abdominal cramping in pregnancy at 11 weeks. +FHT at 165 were obtained. She feels this maybe more like nausea and she has no medications. She is driving herself today. She denies any vaginal bleeding. Last intercourse was 2 weeks ago. [facility] and [facility]  Possible Pregnancy Associated symptoms include abdominal pain, nausea and vomiting.  Abdominal Cramping Associated symptoms include nausea and vomiting.      Past Medical History  Diagnosis Date  . Obese     Past Surgical History  Procedure Laterality Date  . No past surgeries      Family History  Problem Relation Age of Onset  . Seizures Son   . Hypertension Maternal Aunt   . Hypertension Paternal Uncle   . Diabetes Maternal Grandmother   . Diabetes Maternal Grandfather   . Diabetes Paternal Grandmother   . Diabetes Paternal Grandfather     History  Substance Use Topics  . Smoking status: Never Smoker   . Smokeless tobacco: Not on file  . Alcohol Use: No    Allergies: No Known Allergies  Prescriptions prior to admission  Medication Sig Dispense Refill  . folic acid (FOLVITE) 400 MCG tablet Take 2 tablets (800 mcg total) by mouth daily.  30 tablet  3  . Prenatal Vit-Fe Fumarate-FA (PRENATAL VITAMINS PLUS) 27-1 MG TABS Take 1 tablet by mouth daily. Generic is OK - Prenatal vitamin with 1 mg Folic acid  30 tablet  0  . promethazine (PHENERGAN) 25 MG tablet Take 1 tablet (25 mg total) by mouth every 6 (six) hours as needed for nausea.  30 tablet  1    Review of Systems  Constitutional: Negative.   HENT: Negative.   Respiratory: Negative.   Cardiovascular: Negative.    Gastrointestinal: Positive for nausea, vomiting and abdominal pain.  Genitourinary: Negative.   Skin: Negative.   Neurological: Negative.   Psychiatric/Behavioral: Negative.    Physical Exam   Blood pressure 137/76, pulse 92, temperature 98.9 F (37.2 C), temperature source Oral, resp. rate 20, height 5\' 4"  (1.626 m), weight 347 lb 9.6 oz (157.67 kg), last menstrual period 11/03/2013, SpO2 100.00%, unknown if currently breastfeeding.  Physical Exam  Constitutional: She is oriented to person, place, and time. She appears well-developed and well-nourished.  Morbid obesity  HENT:  Head: Normocephalic and atraumatic.  Eyes: Conjunctivae are normal. Pupils are equal, round, and reactive to light.  Cardiovascular: Normal rate, regular rhythm and normal heart sounds.   Respiratory: Effort normal and breath sounds normal.  GI: Soft. She exhibits no distension and no mass. There is no tenderness. There is no rebound.  Difficult to examine due to obesity  Genitourinary:  Genital:External Negative Vaginal:small amount odorous discharge Cervix:closed/ thick Bimanual:nontender   Musculoskeletal: Normal range of motion.  Neurological: She is alert and oriented to person, place, and time.  Skin: Skin is dry.  Psychiatric: She has a normal mood and affect. Her behavior is normal. Judgment and thought content normal.   Results for orders placed during the hospital encounter of 01/19/14 (from the past 24 hour(s))  URINALYSIS, ROUTINE W REFLEX MICROSCOPIC     Status:  None   Collection Time    01/19/14 10:00 AM      Result Value Ref Range   Color, Urine YELLOW  YELLOW   APPearance CLEAR  CLEAR   Specific Gravity, Urine 1.025  1.005 - 1.030   pH 6.0  5.0 - 8.0   Glucose, UA NEGATIVE  NEGATIVE mg/dL   Hgb urine dipstick NEGATIVE  NEGATIVE   Bilirubin Urine NEGATIVE  NEGATIVE   Ketones, ur NEGATIVE  NEGATIVE mg/dL   Protein, ur NEGATIVE  NEGATIVE mg/dL   Urobilinogen, UA 0.2  0.0 - 1.0 mg/dL    Nitrite NEGATIVE  NEGATIVE   Leukocytes, UA NEGATIVE  NEGATIVE  POCT PREGNANCY, URINE     Status: Abnormal   Collection Time    01/19/14 10:12 AM      Result Value Ref Range   Preg Test, Ur POSITIVE (*) NEGATIVE  WET PREP, GENITAL     Status: Abnormal   Collection Time    01/19/14 10:42 AM      Result Value Ref Range   Yeast Wet Prep HPF POC NONE SEEN  NONE SEEN   Trich, Wet Prep NONE SEEN  NONE SEEN   Clue Cells Wet Prep HPF POC NONE SEEN  NONE SEEN   WBC, Wet Prep HPF POC FEW (*) NONE SEEN  CBC     Status: Abnormal   Collection Time    01/19/14 11:02 AM      Result Value Ref Range   WBC 6.5  4.0 - 10.5 K/uL   RBC 4.37  3.87 - 5.11 MIL/uL   Hemoglobin 11.7 (*) 12.0 - 15.0 g/dL   HCT 16.1  09.6 - 04.5 %   MCV 82.8  78.0 - 100.0 fL   MCH 26.8  26.0 - 34.0 pg   MCHC 32.3  30.0 - 36.0 g/dL   RDW 40.9  81.1 - 91.4 %   Platelets 241  150 - 400 K/uL    MAU Course  Procedures  MDM  UA, CBC, Wet prep, GC Reglan 10 mg po for nausea  Assessment and Plan   A: Abdominal cramping in first trimester likely nausea  P: Phenergan 25 mg po q6 hours prn nausea Advised fluids and rest Establish prenatal care asap Return as needed  Carolynn Serve 01/19/2014, 10:46 AM

## 2014-01-19 NOTE — Discharge Instructions (Signed)
Abdominal Pain During Pregnancy Abdominal pain is common in pregnancy. Most of the time, it does not cause harm. There are many causes of abdominal pain. Some causes are more serious than others. Some of the causes of abdominal pain in pregnancy are easily diagnosed. Occasionally, the diagnosis takes time to understand. Other times, the cause is not determined. Abdominal pain can be a sign that something is very wrong with the pregnancy, or the pain may have nothing to do with the pregnancy at all. For this reason, always tell your health care provider if you have any abdominal discomfort. HOME CARE INSTRUCTIONS  Monitor your abdominal pain for any changes. The following actions may help to alleviate any discomfort you are experiencing:  Do not have sexual intercourse or put anything in your vagina until your symptoms go away completely.  Get plenty of rest until your pain improves.  Drink clear fluids if you feel nauseous. Avoid solid food as long as you are uncomfortable or nauseous.  Only take over-the-counter or prescription medicine as directed by your health care provider.  Keep all follow-up appointments with your health care provider. SEEK IMMEDIATE MEDICAL CARE IF:  You are bleeding, leaking fluid, or passing tissue from the vagina.  You have increasing pain or cramping.  You have persistent vomiting.  You have painful or bloody urination.  You have a fever.  You notice a decrease in your baby's movements.  You have extreme weakness or feel faint.  You have shortness of breath, with or without abdominal pain.  You develop a severe headache with abdominal pain.  You have abnormal vaginal discharge with abdominal pain.  You have persistent diarrhea.  You have abdominal pain that continues even after rest, or gets worse. MAKE SURE YOU:   Understand these instructions.  Will watch your condition.  Will get help right away if you are not doing well or get  worse. Document Released: 10/07/2005 Document Revised: 07/28/2013 Document Reviewed: 05/06/2013 [facility] Patient Information 2014 [place] [place]. Nausea and Vomiting Nausea is a sick feeling that often comes before throwing up (vomiting). Vomiting is a reflex where stomach contents come out of your mouth. Vomiting can cause severe loss of body fluids (dehydration). Children and elderly adults can become dehydrated quickly, especially if they also have diarrhea. Nausea and vomiting are symptoms of a condition or disease. It is important to find the cause of your symptoms. CAUSES   Direct irritation of the stomach lining. This irritation can result from increased acid production (gastroesophageal reflux disease), infection, food poisoning, taking certain medicines (such as nonsteroidal anti-inflammatory drugs), alcohol use, or tobacco use.  Signals from the brain.These signals could be caused by a headache, heat exposure, an inner ear disturbance, increased pressure in the brain from injury, infection, a tumor, or a concussion, pain, emotional stimulus, or metabolic problems.  An obstruction in the gastrointestinal tract (bowel obstruction).  Illnesses such as diabetes, hepatitis, gallbladder problems, appendicitis, kidney problems, cancer, sepsis, atypical symptoms of a heart attack, or eating disorders.  Medical treatments such as chemotherapy and radiation.  Receiving medicine that makes you sleep (general anesthetic) during surgery. DIAGNOSIS Your caregiver may ask for tests to be done if the problems do not improve after a few days. Tests may also be done if symptoms are severe or if the reason for the nausea and vomiting is not clear. Tests may include:  Urine tests.  Blood tests.  Stool tests.  Cultures (to look for evidence of infection).  X-rays or  other imaging studies. Test results can help your caregiver make decisions about treatment or the need for additional  tests. TREATMENT You need to stay well hydrated. Drink frequently but in small amounts.You may wish to drink water, sports drinks, clear broth, or eat frozen ice pops or gelatin dessert to help stay hydrated.When you eat, eating slowly may help prevent nausea.There are also some antinausea medicines that may help prevent nausea. HOME CARE INSTRUCTIONS   Take all medicine as directed by your caregiver.  If you do not have an appetite, do not force yourself to eat. However, you must continue to drink fluids.  If you have an appetite, eat a normal diet unless your caregiver tells you differently.  Eat a variety of complex carbohydrates (rice, wheat, potatoes, bread), lean meats, yogurt, fruits, and vegetables.  Avoid high-fat foods because they are more difficult to digest.  Drink enough water and fluids to keep your urine clear or pale yellow.  If you are dehydrated, ask your caregiver for specific rehydration instructions. Signs of dehydration may include:  Severe thirst.  Dry lips and mouth.  Dizziness.  Dark urine.  Decreasing urine frequency and amount.  Confusion.  Rapid breathing or pulse. SEEK IMMEDIATE MEDICAL CARE IF:   You have blood or brown flecks (like coffee grounds) in your vomit.  You have black or bloody stools.  You have a severe headache or stiff neck.  You are confused.  You have severe abdominal pain.  You have chest pain or trouble breathing.  You do not urinate at least once every 8 hours.  You develop cold or clammy skin.  You continue to vomit for longer than 24 to 48 hours.  You have a fever. MAKE SURE YOU:   Understand these instructions.  Will watch your condition.  Will get help right away if you are not doing well or get worse. Document Released: 10/07/2005 Document Revised: 12/30/2011 Document Reviewed: 03/06/2011 [facility] Patient Information 2014 [place], [place].

## 2014-01-19 NOTE — MAU Provider Note (Signed)
Attestation of Attending Supervision of Advanced Practitioner (CNM/NP): Evaluation and management procedures were performed by the Advanced Practitioner under my supervision and collaboration.  I have reviewed the Advanced Practitioner's note and chart, and I agree with the management and plan.  HARRAWAY-SMITH, Omelia Marquart 2:15 PM

## 2014-01-19 NOTE — MAU Note (Signed)
Patient states she has had a positive home pregnancy test. States she has been having abdominal cramping and for about 2 weeks. States nausea all the time with occasional vomiting. Denies bleeding or vaginal discharge.

## 2014-01-20 LAB — GC/CHLAMYDIA PROBE AMP
CT Probe RNA: NEGATIVE
GC PROBE AMP APTIMA: NEGATIVE

## 2014-03-01 ENCOUNTER — Other Ambulatory Visit: Payer: Self-pay

## 2014-05-30 ENCOUNTER — Other Ambulatory Visit (HOSPITAL_COMMUNITY): Payer: Self-pay | Admitting: Specialist

## 2014-05-30 DIAGNOSIS — O99212 Obesity complicating pregnancy, second trimester: Secondary | ICD-10-CM

## 2014-05-30 DIAGNOSIS — Z3689 Encounter for other specified antenatal screening: Secondary | ICD-10-CM

## 2014-06-01 ENCOUNTER — Ambulatory Visit (HOSPITAL_COMMUNITY)
Admission: RE | Admit: 2014-06-01 | Discharge: 2014-06-01 | Disposition: A | Discharge status: Home / Self Care | Payer: Medicaid Other | Source: Ambulatory Visit | Attending: Specialist | Admitting: Specialist

## 2014-06-01 ENCOUNTER — Encounter (HOSPITAL_COMMUNITY): Payer: Self-pay

## 2014-06-01 VITALS — BP 117/59 | HR 69 | Wt 347.2 lb

## 2014-06-01 DIAGNOSIS — Z3689 Encounter for other specified antenatal screening: Secondary | ICD-10-CM | POA: Insufficient documentation

## 2014-06-01 DIAGNOSIS — O99210 Obesity complicating pregnancy, unspecified trimester: Principal | ICD-10-CM | POA: Insufficient documentation

## 2014-06-01 DIAGNOSIS — E669 Obesity, unspecified: Secondary | ICD-10-CM

## 2014-06-01 DIAGNOSIS — O99212 Obesity complicating pregnancy, second trimester: Secondary | ICD-10-CM

## 2014-07-15 IMAGING — US US OB TRANSVAGINAL
1 series · 13 of 26 positions shown · non-contrast
Comparison: Pelvic ultrasound performed 01/16/2013

CLINICAL DATA: Pelvic pain.  Follow-up study to assess for presence
of embryo.

TRANSVAGINAL OBSTETRIC US
TECHNIQUE: Transvaginal ultrasound was performed for complete
evaluation of the gestation as well as the maternal uterus, adnexal
regions, and pelvic cul-de-sac.

[Series 1: us ob transvaginal · 13 of 26 slices shown]
[im 2/26]
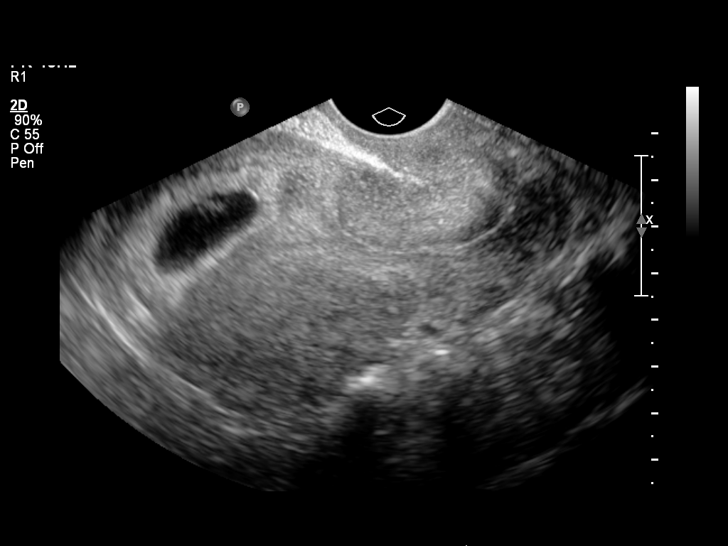
[im 4/26]
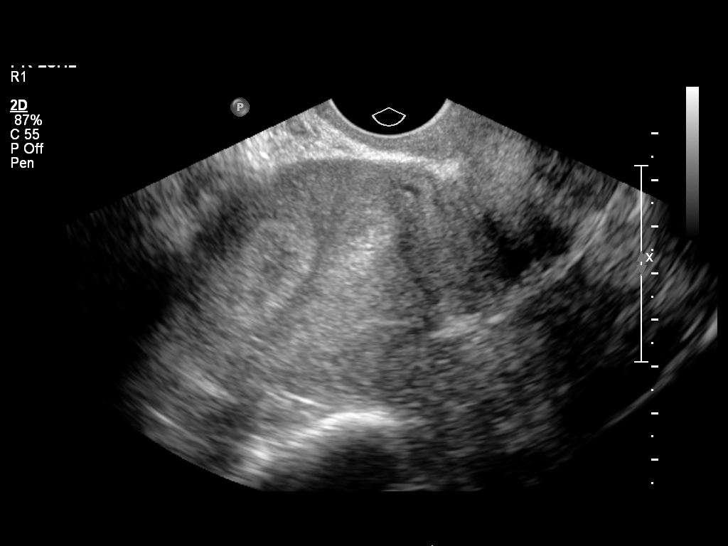
[im 6/26]
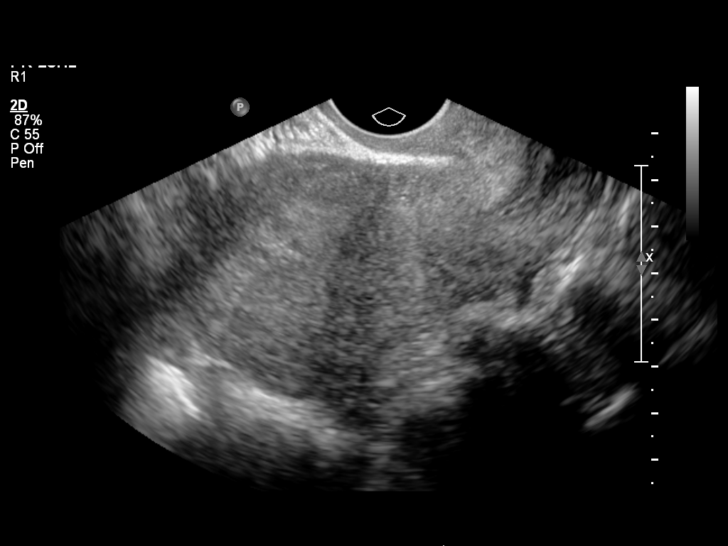
[im 8/26]
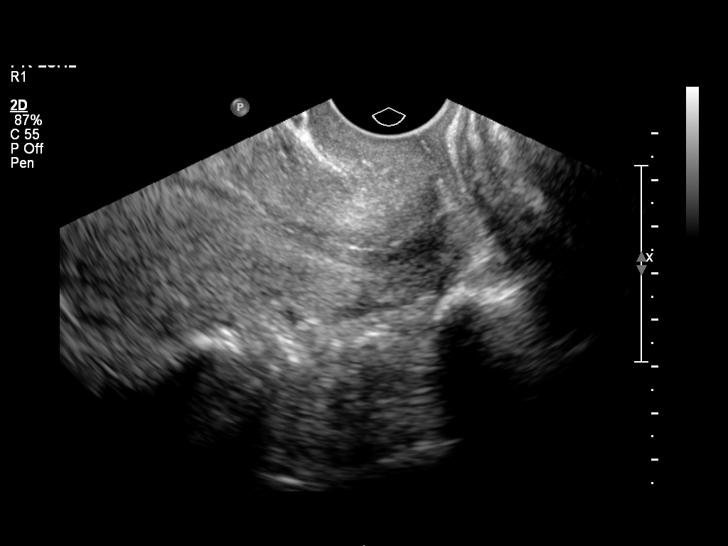
[im 10/26]
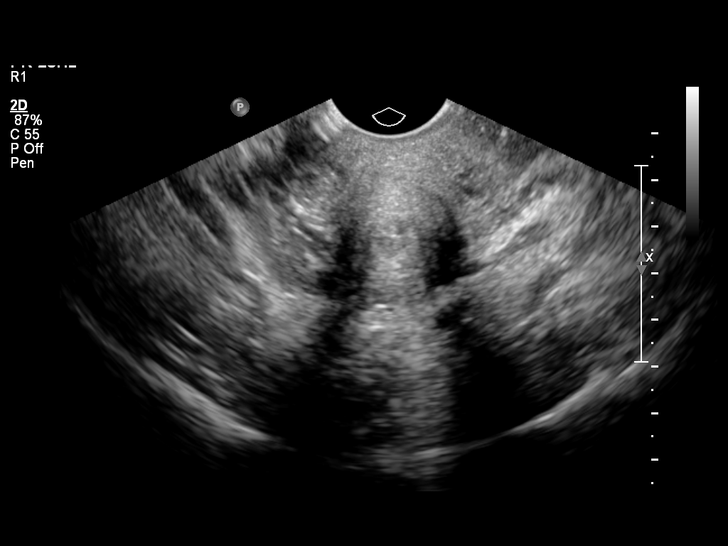
[im 12/26]
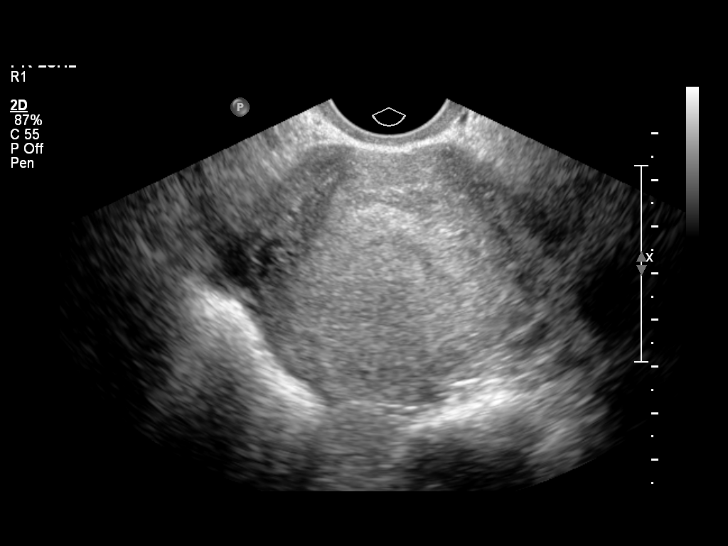
[im 14/26]
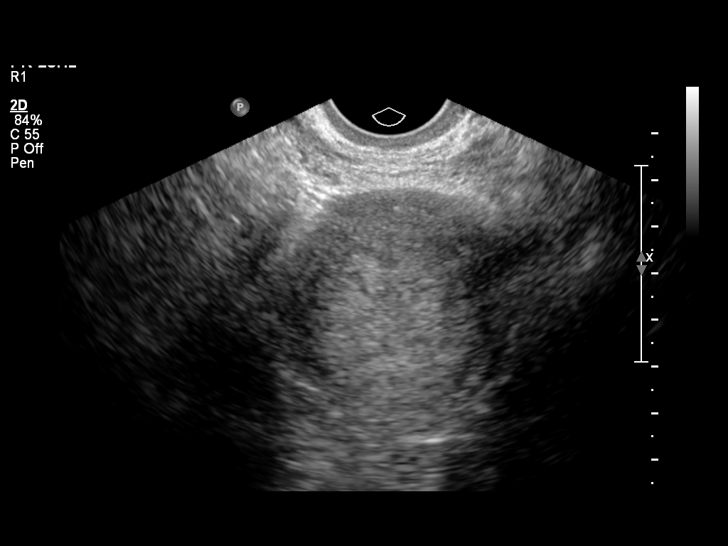
[im 16/26]
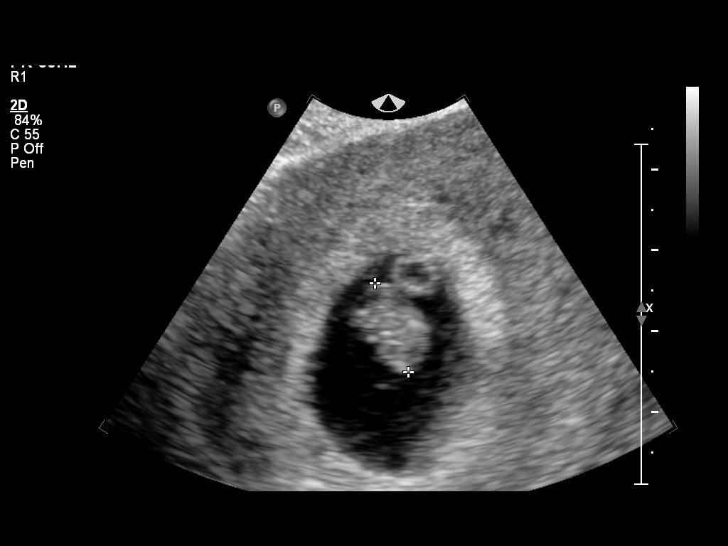
[im 18/26]
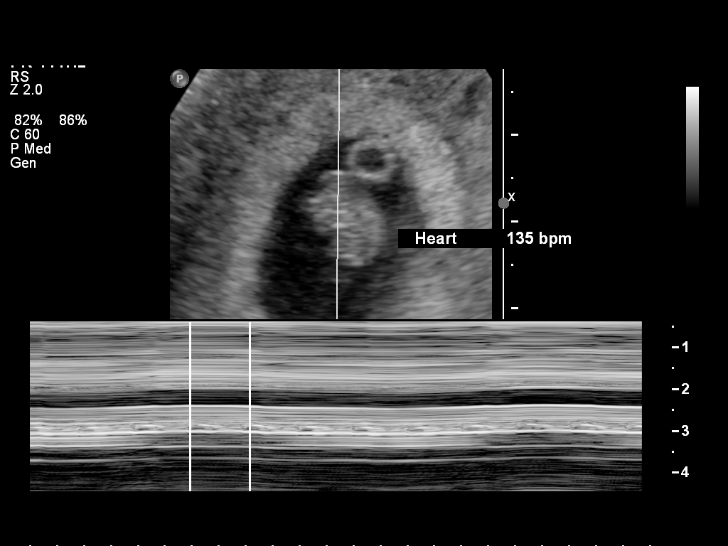
[im 20/26]
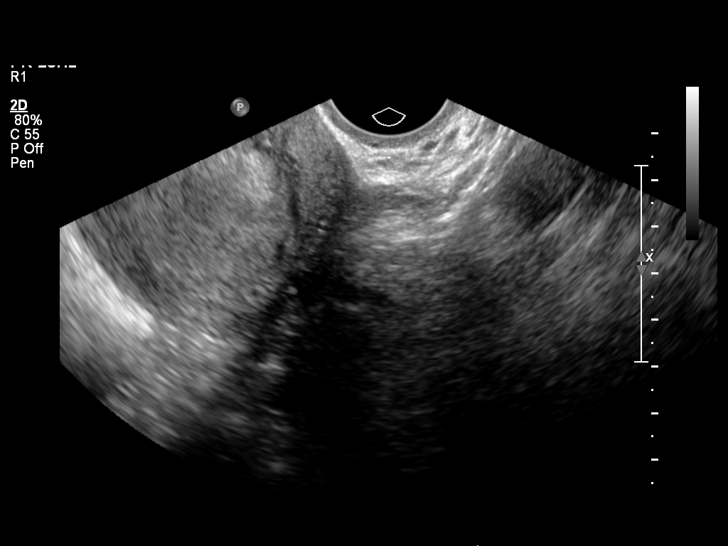
[im 22/26]
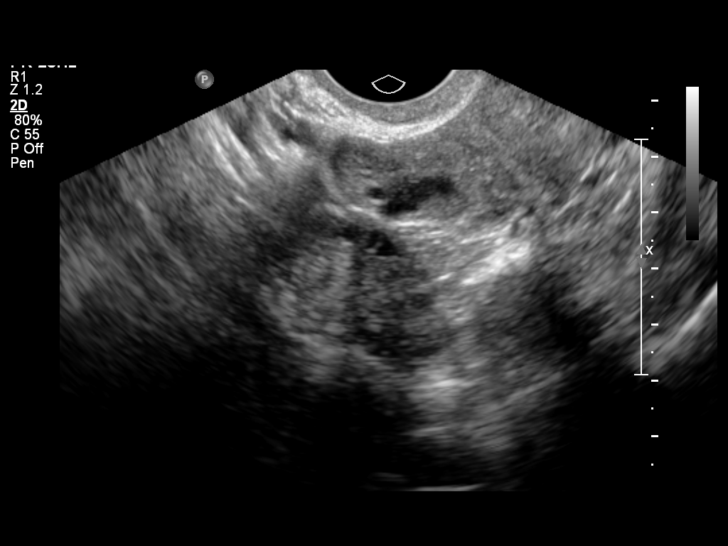
[im 24/26]
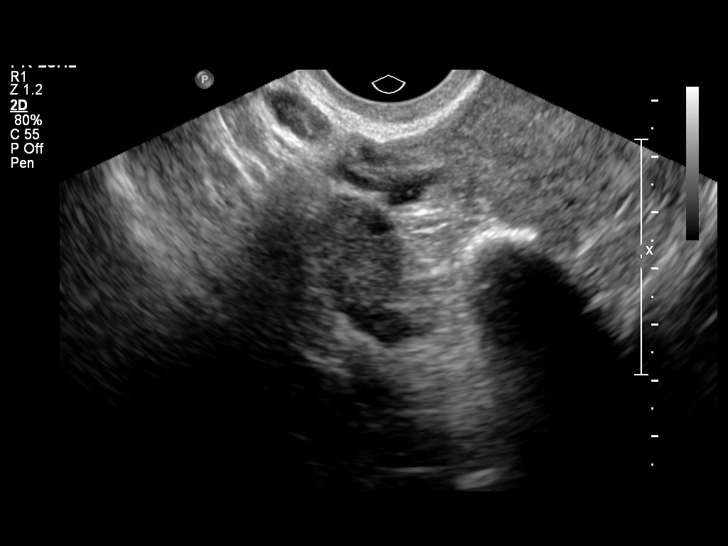
[im 26/26]
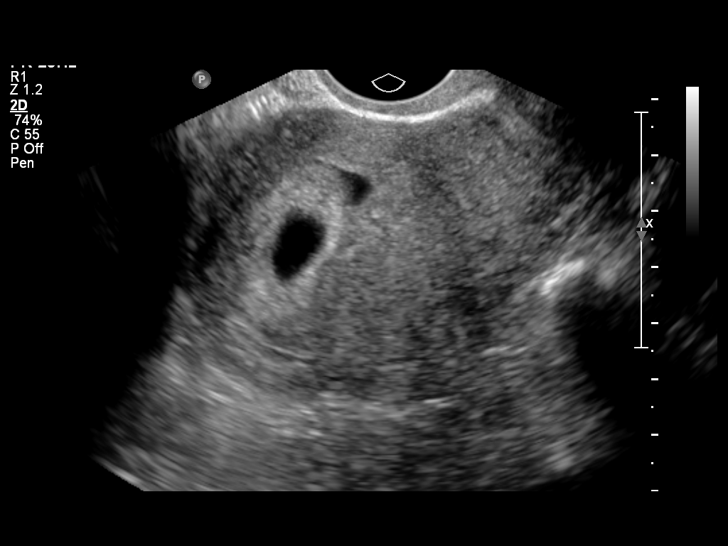

[13 of 26 positions shown; findings below may reference images not displayed]

Intrauterine gestational sac: Visualized; normal in shape.
Yolk sac: Yes
Embryo: Yes
Cardiac Activity: Yes
Heart Rate: 135

CRL: 1.16 cm           7   w  3   d             US EDC: 09/14/2013

Subchorionic hemorrhage: A small amount of subchorionic hemorrhage
is noted.

Maternal uterus/adnexae:
The uterus otherwise unremarkable in appearance.

The right ovary is within normal limits, measuring 2.4 x 1.6 x
cm.  The left ovary is not visualized, but was noted to be normal
in appearance on the recent prior study.  No suspicious adnexal
masses are seen; there is no evidence for ovarian torsion.

No free fluid is seen in the pelvic cul-de-sac.
IMPRESSION: 1.  Single live intrauterine pregnancy noted, with a crown-rump
length of 1.2 cm, corresponding to a gestational age of 7 weeks 3
days.  This matches the gestational age of 7 weeks 6 days by LMP,
reflecting an estimated date of delivery September 11, 2013.
2.  Small amount of subchorionic hemorrhage noted.

## 2014-08-22 ENCOUNTER — Encounter (HOSPITAL_COMMUNITY): Payer: Self-pay

## 2014-11-24 ENCOUNTER — Encounter (HOSPITAL_COMMUNITY): Payer: Self-pay | Admitting: *Deleted

## 2015-11-15 IMAGING — US US OB DETAIL+14 WK
1 series · 12 of 28 positions shown · non-contrast
Comparison: none

[Series 1: us ob detail+14 wk · 0.20mm/px · 60 acquisitions, 12 frames shown]
[im 3/60]
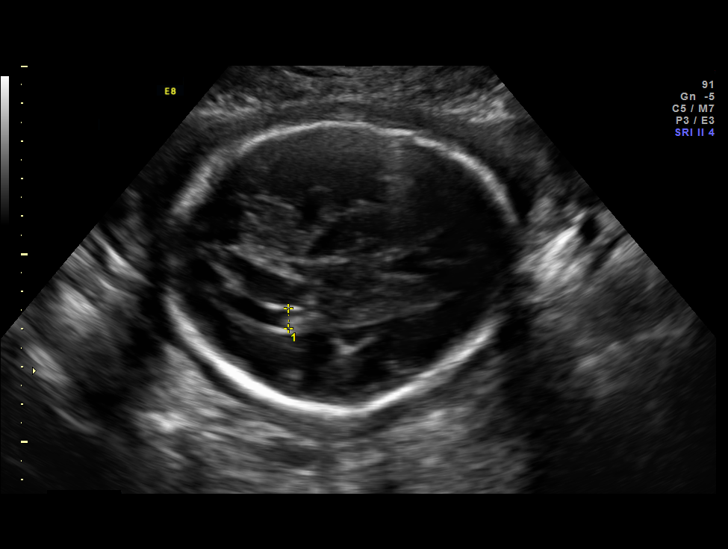
[im 7/60]
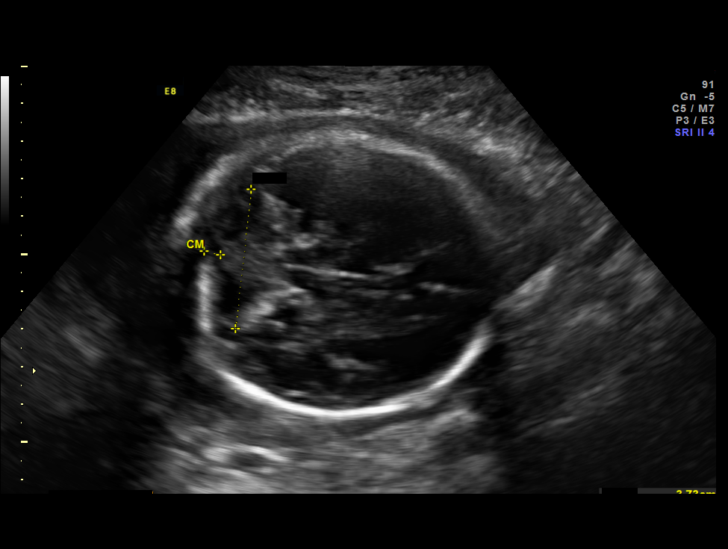
[im 11/60]
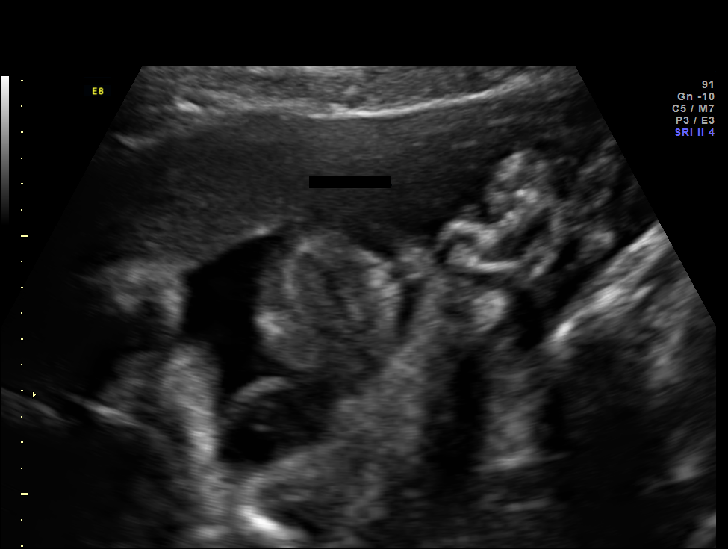
[im 18/60]
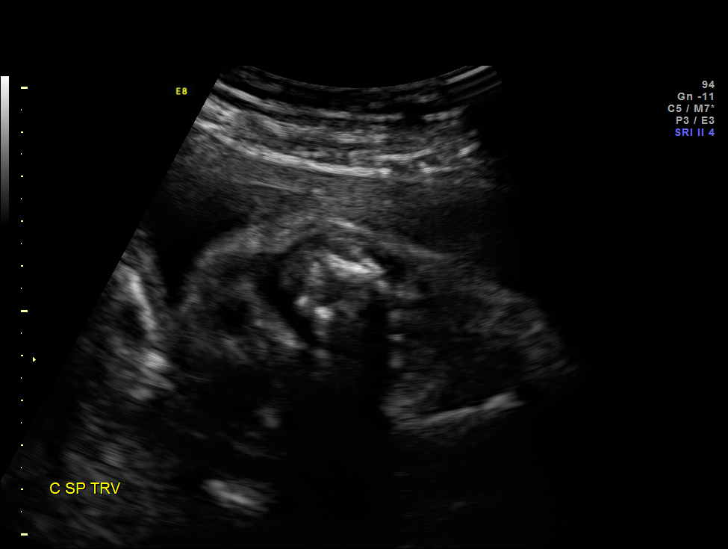
[im 22/60]
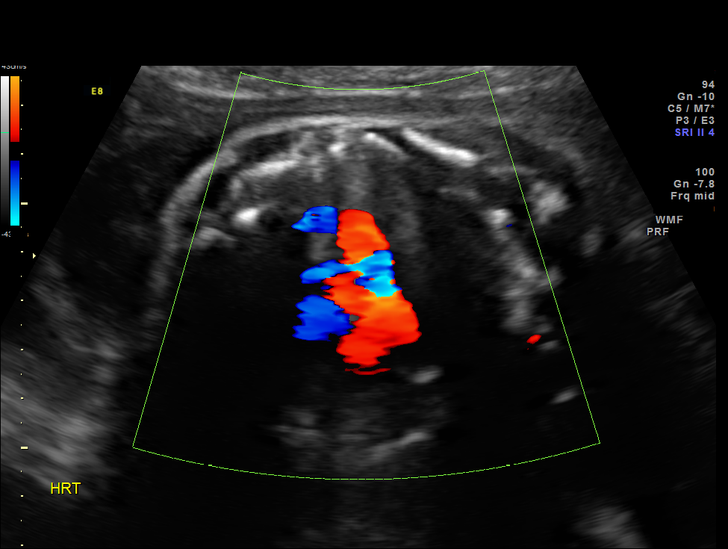
[im 27/60]
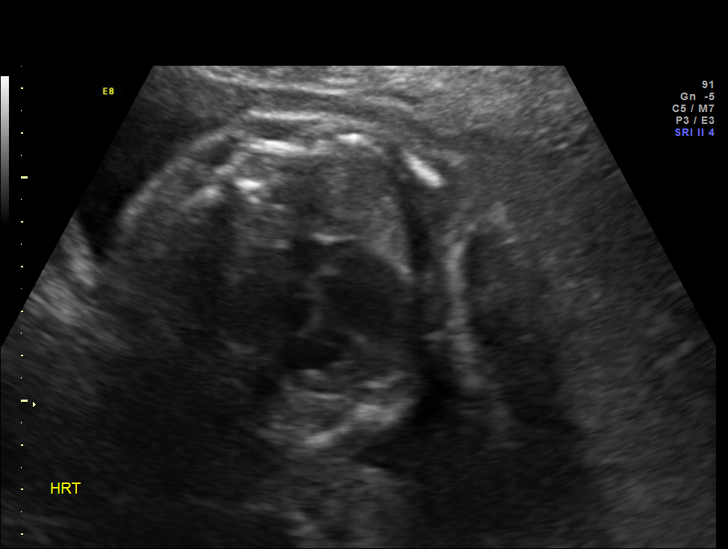
[im 33/60]
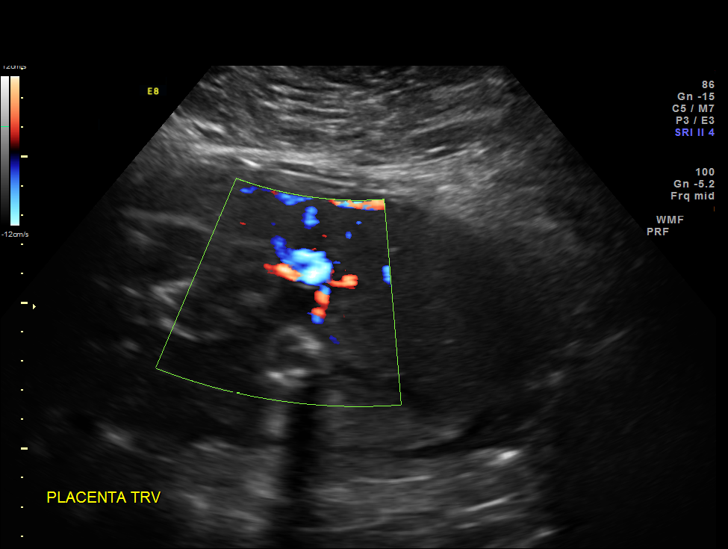
[im 38/60]
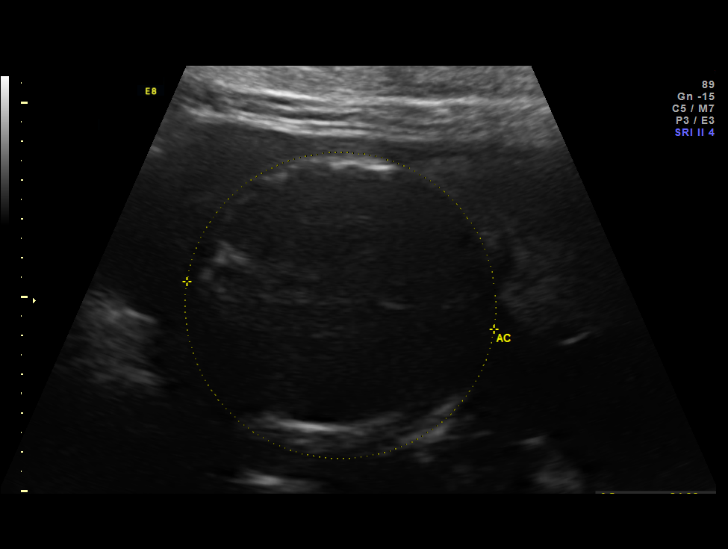
[im 42/60]
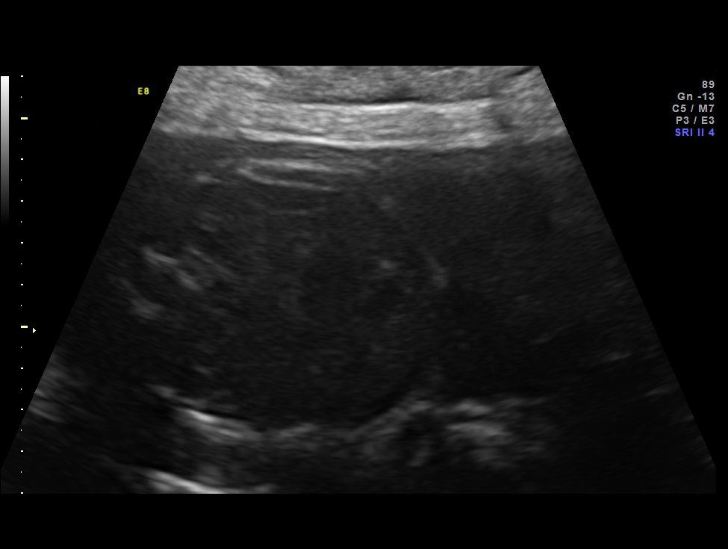
[im 49/60]
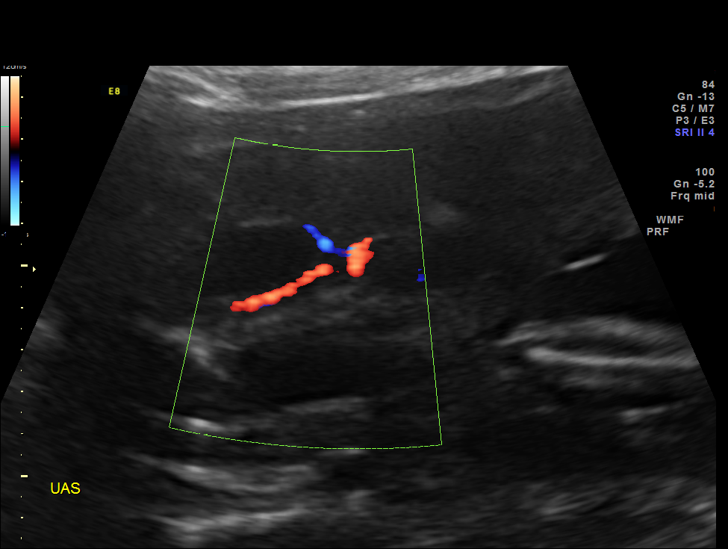
[im 53/60]
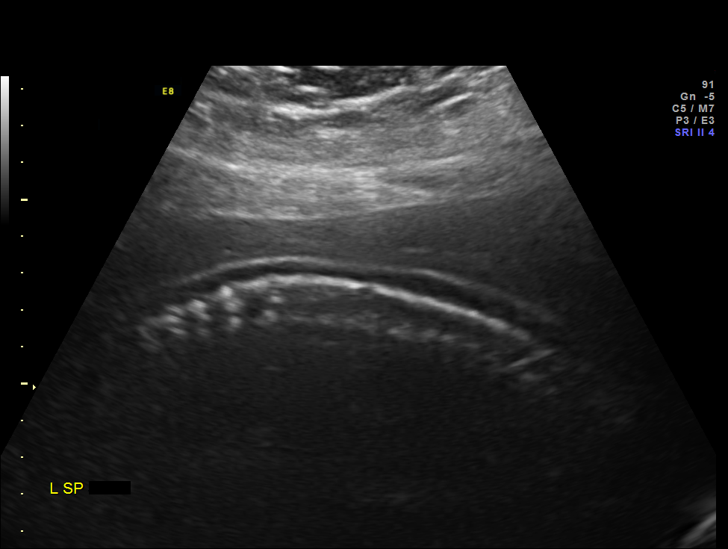
[im 57/60]
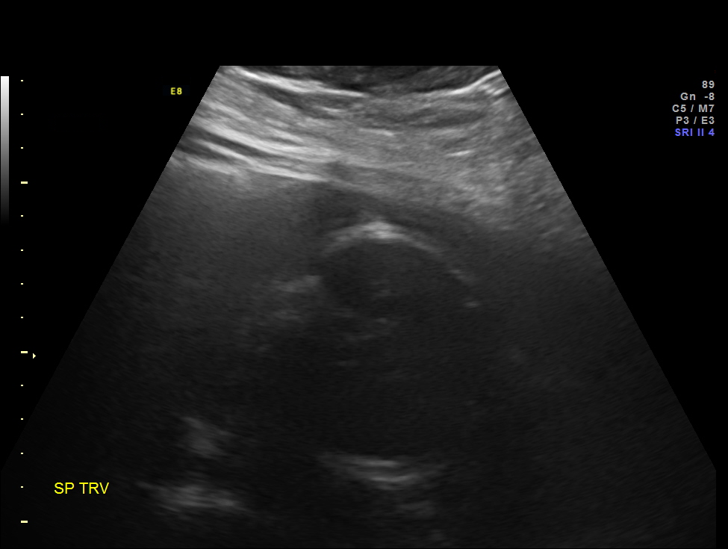

[12 of 28 positions shown; findings below may reference images not displayed]

OBSTETRICS REPORT
                      (Signed Final 06/01/2014 [DATE])

Service(s) Provided

 US OB DETAIL + 14 WK                                  76811.0
Indications

 Detailed fetal anatomic survey
 Maternal morbid obesity (347 lb)
Fetal Evaluation

 Num Of Fetuses:    1
 Fetal Heart Rate:  135                          bpm
 Cardiac Activity:  Observed
 Presentation:      Cephalic
 Placenta:          Anterior, above cervical os
 P. Cord            Visualized, central
 Insertion:

 Amniotic Fluid
 AFI FV:      Subjectively within normal limits
 AFI Sum:     11.62   cm       26  %Tile     Larg Pckt:    3.88  cm
 RUQ:   2.83    cm   RLQ:    3.88   cm    LUQ:   2.94    cm   LLQ:    1.97   cm
Biometry

 BPD:       76  mm     G. Age:  30w 4d                CI:         78.8   70 - 86
 OFD:     96.5  mm                                    FL/HC:      20.5   19.2 -

 HC:     275.4  mm     G. Age:  30w 1d       19  %    HC/AC:      1.11   0.99 -

 AC:     248.5  mm     G. Age:  29w 0d       20  %    FL/BPD:     74.3   71 - 87
 FL:      56.5  mm     G. Age:  29w 5d       26  %    FL/AC:      22.7   20 - 24
 HUM:     52.9  mm     G. Age:  30w 6d       67  %
 CER:     37.3  mm     G. Age:  32w 1d       76  %

 Est. FW:    0911  gm      3 lb 1 oz     42  %
Gestational Age

 LMP:           30w 0d        Date:  11/03/13                 EDD:   08/10/14
 U/S Today:     29w 6d                                        EDD:   08/11/14
 Best:          30w 0d     Det. By:  LMP  (11/03/13)          EDD:   08/10/14
Anatomy
 Cranium:          Appears normal         Aortic Arch:      Appears normal
 Fetal Cavum:      Appears normal         Ductal Arch:      Not well visualized
 Ventricles:       Appears normal         Diaphragm:        Not well visualized
 Choroid Plexus:   Appears normal         Stomach:          Appears normal, left
                                                            sided
 Cerebellum:       Appears normal         Abdomen:          Appears normal
 Posterior Fossa:  Appears normal         Abdominal Wall:   Not well visualized
 Nuchal Fold:      Not applicable (>20    Cord Vessels:     Appears normal (3
                   wks GA)                                  vessel cord)
 Face:             Orbits nl; profile not Kidneys:          Appear normal
                   well visualized
 Lips:             Appears normal         Bladder:          Appears normal
 Heart:            Not well visualized    Spine:            Appears normal
 RVOT:             Not well visualized    Lower             Visualized
                                          Extremities:
 LVOT:             Not well visualized    Upper             Visualized
                                          Extremities:

 Other:  Technically difficult due to maternal habitus and fetal position.
Targeted Anatomy

 Fetal Central Nervous System
 Cisterna Magna:
Cervix Uterus Adnexa

 Cervix:       Not visualized (advanced GA >44wks)

 Adnexa:     No abnormality visualized.
Impression

 Single IUP at 29w 6d
 Morbid obesity
 Limited views of the heart, diaphragm, face and adbdominal
 wall obtained due to maternal body habitus and fetal position
 No gross anomalies noted
 Estimated fetal weight at the 42nd %tile
 Normal amniotic fluid volume
Recommendations

 Follow-up ultrasounds as clinically indicated.

 questions or concerns.

## 2017-10-16 ENCOUNTER — Encounter (HOSPITAL_BASED_OUTPATIENT_CLINIC_OR_DEPARTMENT_OTHER): Payer: Self-pay

## 2017-10-16 ENCOUNTER — Emergency Department (HOSPITAL_BASED_OUTPATIENT_CLINIC_OR_DEPARTMENT_OTHER)
Admission: EM | Admit: 2017-10-16 | Discharge: 2017-10-16 | Discharge status: Against Medical Advice | Payer: Self-pay | Attending: Emergency Medicine | Admitting: Emergency Medicine

## 2017-10-16 ENCOUNTER — Other Ambulatory Visit: Payer: Self-pay

## 2017-10-16 DIAGNOSIS — Z5321 Procedure and treatment not carried out due to patient leaving prior to being seen by health care provider: Secondary | ICD-10-CM | POA: Insufficient documentation

## 2017-10-16 DIAGNOSIS — K0889 Other specified disorders of teeth and supporting structures: Secondary | ICD-10-CM | POA: Insufficient documentation

## 2017-10-16 NOTE — ED Notes (Signed)
Informed by registration that pt left 

## 2017-10-16 NOTE — ED Triage Notes (Signed)
C/o right lower toothache x 1 week-NAD-steady gait

## 2017-10-18 ENCOUNTER — Emergency Department (HOSPITAL_BASED_OUTPATIENT_CLINIC_OR_DEPARTMENT_OTHER)
Admission: EM | Admit: 2017-10-18 | Discharge: 2017-10-18 | Disposition: A | Discharge status: Home / Self Care | Payer: Medicaid - Out of State | Attending: Emergency Medicine | Admitting: Emergency Medicine

## 2017-10-18 ENCOUNTER — Other Ambulatory Visit: Payer: Self-pay

## 2017-10-18 ENCOUNTER — Encounter (HOSPITAL_BASED_OUTPATIENT_CLINIC_OR_DEPARTMENT_OTHER): Payer: Self-pay | Admitting: Emergency Medicine

## 2017-10-18 DIAGNOSIS — K089 Disorder of teeth and supporting structures, unspecified: Secondary | ICD-10-CM | POA: Diagnosis present

## 2017-10-18 DIAGNOSIS — R600 Localized edema: Secondary | ICD-10-CM | POA: Insufficient documentation

## 2017-10-18 DIAGNOSIS — K0889 Other specified disorders of teeth and supporting structures: Secondary | ICD-10-CM

## 2017-10-18 MED ORDER — NAPROXEN 250 MG PO TABS
500.0000 mg | ORAL_TABLET | Freq: Once | ORAL | Status: AC
  Administered 2017-10-18: 500 mg via ORAL
  Filled 2017-10-18: qty 2

## 2017-10-18 MED ORDER — CLINDAMYCIN HCL 150 MG PO CAPS: 450.0000 mg | ORAL_CAPSULE | Freq: Three times a day (TID) | ORAL | 0 refills | Status: DC

## 2017-10-18 MED ORDER — NAPROXEN 500 MG PO TABS: 500.0000 mg | ORAL_TABLET | Freq: Two times a day (BID) | ORAL | 0 refills | Status: AC

## 2017-10-18 NOTE — ED Notes (Signed)
Patient is A & O x4.  She understood AVS instructions.  

## 2017-10-18 NOTE — ED Triage Notes (Signed)
[facility] seen [facility] yesterday and diagnosed with tooth abscess. Pt was given prescription for PCN. Pt woke up today with increased swelling to right side of jaw and bottom lip. Pt denies shortness of breath.

## 2017-10-18 NOTE — ED Provider Notes (Signed)
[facility] EMERGENCY DEPARTMENT Provider Note   CSN: [id number] Arrival date & time: 10/18/17  1129     History   Chief Complaint Chief Complaint  Patient presents with  . Dental Pain  . Allergic Reaction    possible    HPI Brooke Henderson is a 31 y.o. female who presents the emergency department with concern for allergic reaction after starting penicillin yesterday for a dental infection.  Patient has been having right lower dental pain for the past 4 days, she states that she has had a chipped tooth to the right lower gumline for several years now.  States pain has been constant without significant alleviating or aggravating factors.  She has been trying Advil without significant relief.  Was seen at [facility] ED yesterday and diagnosed with a dental infection, no appreciable abscess at that time, and started on penicillin VK.  States that she had 4 doses of the penicillin yesterday, none today, no change in pain.  She went to bed with some right-sided facial swelling however woke up with a significant increase in the swelling, which led to her concern for an allergic reaction to the penicillin.  At present rates her dental pain a 10 out of 10 in severity.  Denies fever, chills, neck pain, neck stiffness, drooling, or difficulty swallowing.  Denies rash, sensation of throat closing, or difficulty breathing. HPI  Past Medical History:  Diagnosis Date  . Obese     Patient Active Problem List   Diagnosis Date Noted  . Obesity complicating pregnancy, childbirth, or the puerperium, antepartum condition or complication(649.13) 06/01/2014    Past Surgical History:  Procedure Laterality Date  . NO PAST SURGERIES      OB History    Gravida Para Term Preterm AB Living   5 4 4  0 0 4   SAB TAB Ectopic Multiple Live Births   0 0 0 0 4       Home Medications    Prior to Admission medications   Medication Sig Start Date End Date Taking? Authorizing Provider    penicillin v potassium (VEETID) 500 MG tablet Take 500 mg by mouth 4 (four) times daily.   Yes [provider]    Family History Family History  Problem Relation Age of Onset  . Seizures Son   . Hypertension Maternal Aunt   . Hypertension Paternal Uncle   . Diabetes Maternal Grandmother   . Diabetes Maternal Grandfather   . Diabetes Paternal Grandmother   . Diabetes Paternal Grandfather     Social History Social History   Tobacco Use  . Smoking status: Never Smoker  . Smokeless tobacco: Never Used  Substance Use Topics  . Alcohol use: No  . Drug use: No     Allergies   Patient has no known allergies.   Review of Systems Review of Systems  Constitutional: Negative for chills and fever.  HENT: Positive for dental problem (R lower). Negative for drooling, ear pain, sore throat and trouble swallowing.        Positive for R sided facial swelling.   Respiratory: Negative for shortness of breath.   Cardiovascular: Negative for chest pain.  Musculoskeletal: Negative for neck pain.  Skin: Negative for rash.     Physical Exam Updated Vital Signs BP (!) 151/105 (BP Location: Left Arm)   Pulse 88   Temp 99 F (37.2 C) (Oral)   Resp 20   Ht 5\' 6"  (1.676 m)   Wt (!) 158.8  kg (350 lb)   LMP 09/29/2017   SpO2 100%   Breastfeeding? No   BMI 56.49 kg/m   Physical Exam  Constitutional: She appears well-developed and well-nourished.  Non-toxic appearance. No distress.  HENT:  Head: Normocephalic and atraumatic.  Right Ear: Tympanic membrane normal.  Left Ear: Tympanic membrane normal.  Nose: Nose normal.  Mouth/Throat: Uvula is midline and oropharynx is clear and moist. No trismus in the jaw. No dental abscesses or uvula swelling. No oropharyngeal exudate, posterior oropharyngeal edema or tonsillar abscesses.    R sided facial swelling, more prominent to R jaw area, extends to R lower lip. No angioedema.    Eyes: Conjunctivae are normal. Right eye exhibits  no discharge. Left eye exhibits no discharge.  Neck: Normal range of motion. Neck supple. No erythema present.  Submandibular compartments are soft.   Cardiovascular: Normal rate and regular rhythm.  No murmur heard. Pulmonary/Chest: Effort normal and breath sounds normal. She has no wheezes. She has no rhonchi. She has no rales.  Neurological: She is alert.  Clear speech.   Psychiatric: She has a normal mood and affect. Her behavior is normal. Thought content normal.  Nursing note and vitals reviewed.  ED Treatments / Results  Labs (all labs ordered are listed, but only abnormal results are displayed) Labs Reviewed - No data to display  EKG  EKG Interpretation None      Radiology No results found.  Procedures Procedures (including critical care time)  Medications Ordered in ED Medications  naproxen (NAPROSYN) tablet 500 mg (not administered)     Initial Impression / Assessment and Plan / ED Course  I have reviewed the triage vital signs and the nursing notes.  Pertinent labs & imaging results that were available during my care of the patient were reviewed by me and considered in my medical decision making (see chart for details).    Patient presents with dental pain and R sided facial swelling with concern for allergic reaction to PCN started yesterday. Patient is nontoxic appearing with stable vital signs.  No gross abscess.  Exam unconcerning for Ludwig's angina or spread of infection- patient is tolerating PO, no trismus, no drooling, she has full painless ROM of the neck, and submandibular compartments are soft. Patient without dyspnea, feeling of throat closing, or rash, swelling is localized to R side of face and R lower lip, no angioedema, do not suspect this is an allergic reaction given localized swelling, suspect this is secondary to dental infection. Will stop PCN and switch patient to Clindamycin. Will also prescribe Naproxen, first dose in the ED. Urged patient to  follow-up with dentist, dental resources were provided.  Discussed treatment plan and need for follow up as well as return precautions. Provided opportunity for questions, patient confirmed understanding and is agreeable to plan.  Findings and plan of care discussed with supervising physician Dr. Jacqulyn BathLong who personally evaluated and examined this patient.  Final Clinical Impressions(s) / ED Diagnoses   Final diagnoses:  Pain, dental    ED Discharge Orders        Ordered    clindamycin (CLEOCIN) 150 MG capsule  3 times daily     10/18/17 1202    naproxen (NAPROSYN) 500 MG tablet  2 times daily     10/18/17 [address]1202       Alizon Schmeling R, PA-C 10/18/17 1213    Maia PlanLong, Joshua G, MD 10/18/17 2103

## 2017-10-18 NOTE — Discharge Instructions (Addendum)
Call one of the dentists offices provided to schedule an appointment for re-evaluation and further management within the next 48 hours.   Stop taking the Penicillin. We do not think the swelling you are having is an allergic reaction, we are switching your antibiotic from Penicillin to Clindamycin because it is a stronger antibiotic. We have prescribed you Clindamycin which is an antibiotic to treat the infection and Naproxen which is an anti-inflammatory medicine to treat the pain and swelling.   Please take all of your antibiotics until finished. You may develop abdominal discomfort or diarrhea from the antibiotic.  You may help offset this with probiotics which you can buy at the store (ask your pharmacist if unable to find) or get probiotics in the form of eating yogurt. Do not eat or take the probiotics until 2 hours after your antibiotic. If you are unable to tolerate these side effects follow-up with your primary care provider or return to the emergency department.   If you begin to experience any blistering, rashes, swelling, or difficulty breathing seek medical care for evaluation of potentially more serious side effects.   Be sure to eat something when taking the Naproxen as it can cause stomach upset and at worst stomach bleeding. Do not take additional non steroidal anti-inflammatory medicines such as Ibuprofen, Aleve, or Advil while taking Naproxen. You may supplement with Tylenol.   Please be aware that this medications may interact with other medications you are taking, please be sure to discuss your medication list with your pharmacist. If you are taking birth control the antibiotic will deactivate your birth control for 2 weeks.   If you start to experience and new or worsening symptoms return to the emergency department. If you start to experience fever, chills, neck stiffness/pain, or inability to move your neck come back to the emergency department immediately.

## 2018-10-04 ENCOUNTER — Other Ambulatory Visit: Payer: Self-pay

## 2018-10-04 ENCOUNTER — Encounter (HOSPITAL_BASED_OUTPATIENT_CLINIC_OR_DEPARTMENT_OTHER): Payer: Self-pay | Admitting: Emergency Medicine

## 2018-10-04 ENCOUNTER — Emergency Department (HOSPITAL_BASED_OUTPATIENT_CLINIC_OR_DEPARTMENT_OTHER)
Admission: EM | Admit: 2018-10-04 | Discharge: 2018-10-04 | Disposition: A | Discharge status: Home / Self Care | Payer: Medicaid - Out of State | Attending: Emergency Medicine | Admitting: Emergency Medicine

## 2018-10-04 DIAGNOSIS — Y929 Unspecified place or not applicable: Secondary | ICD-10-CM | POA: Insufficient documentation

## 2018-10-04 DIAGNOSIS — Y999 Unspecified external cause status: Secondary | ICD-10-CM | POA: Insufficient documentation

## 2018-10-04 DIAGNOSIS — Y9384 Activity, sleeping: Secondary | ICD-10-CM | POA: Insufficient documentation

## 2018-10-04 DIAGNOSIS — T161XXA Foreign body in right ear, initial encounter: Secondary | ICD-10-CM | POA: Insufficient documentation

## 2018-10-04 DIAGNOSIS — X58XXXA Exposure to other specified factors, initial encounter: Secondary | ICD-10-CM | POA: Insufficient documentation

## 2018-10-04 NOTE — ED Notes (Signed)
Irrigated RT ear and was able to obtain FB- appears to be a Management consultantplastic fork tong

## 2018-10-04 NOTE — ED Provider Notes (Signed)
[facility] EMERGENCY DEPARTMENT Provider Note   CSN: [id number] Arrival date & time: 10/04/18  1015     History   Chief Complaint Chief Complaint  Patient presents with  . Foreign Body in Ear    HPI Brooke Henderson is a 32 y.o. female with past medical history significant for morbid obesity who presents for evaluation of foreign body in right ear.  Patient states that she was sleeping this morning and her kids were playing in room and she noticed sensation to her right ear.  Patient states that her kids told her that they placed something in her right ear. Denies fever, chills, headache, tinnitus, ear discharge, bleeding from her ear, neck pain, neck stiffness.  States she has not tried to place anything in her ear to retrieve the foreign body.  States she has mild ear pain with movement of her right ear. Rates her pain a 3/10. Denies radiation of pain. Patient states she is unsure the object that is present in her ear, as her children would not tell her what they placed in her ear.  Denies previous history of hearing or ear abnormalities.  Denies alleviating or aggravating symptoms.  History obtained from patient.  No interpreter was used.  HPI  Past Medical History:  Diagnosis Date  . Obese     Patient Active Problem List   Diagnosis Date Noted  . Obesity complicating pregnancy, childbirth, or the puerperium, antepartum condition or complication(649.13) 06/01/2014    Past Surgical History:  Procedure Laterality Date  . NO PAST SURGERIES       OB History    Gravida  5   Para  4   Term  4   Preterm  0   AB  0   Living  4     SAB  0   TAB  0   Ectopic  0   Multiple  0   Live Births  4            Home Medications    Prior to Admission medications   Medication Sig Start Date End Date Taking? Authorizing Provider  naproxen (NAPROSYN) 500 MG tablet Take 1 tablet (500 mg total) by mouth 2 (two) times daily. 10/18/17   Petrucelli, Pleas Koch, PA-C    Family History Family History  Problem Relation Age of Onset  . Seizures Son   . Hypertension Maternal Aunt   . Hypertension Paternal Uncle   . Diabetes Maternal Grandmother   . Diabetes Maternal Grandfather   . Diabetes Paternal Grandmother   . Diabetes Paternal Grandfather     Social History Social History   Tobacco Use  . Smoking status: Never Smoker  . Smokeless tobacco: Never Used  Substance Use Topics  . Alcohol use: No  . Drug use: No     Allergies   Penicillins   Review of Systems Review of Systems  Constitutional: Negative.   HENT: Positive for ear pain. Negative for congestion, dental problem, drooling, ear discharge, facial swelling and hearing loss.   All other systems reviewed and are negative.    Physical Exam Updated Vital Signs BP (!) 145/87 (BP Location: Right Arm)   Pulse 78   Temp 98.3 F (36.8 C) (Oral)   Resp 18   Ht 5\' 6"  (1.676 m)   Wt (!) 173.1 kg   LMP 09/13/2018   SpO2 100%   BMI 61.59 kg/m   Physical Exam Vitals signs and nursing note reviewed.  Constitutional:  General: She is not in acute distress.    Appearance: She is well-developed. She is not ill-appearing, toxic-appearing or diaphoretic.  HENT:     Head: Atraumatic.     Right Ear: No laceration, drainage or swelling. A foreign body is present. No hemotympanum. Tympanic membrane is not perforated, erythematous, retracted or bulging.     Left Ear: Tympanic membrane, ear canal and external ear normal. No laceration, drainage, swelling or tenderness.  No middle ear effusion. There is no impacted cerumen. No foreign body. Tympanic membrane is not injected, scarred, perforated, erythematous, retracted or bulging.     Ears:     Comments: White, shiny linear object in right ear which appears to lie against the TM. No tenderness with retraction of pinna. Left ear normal without evidence of foreign body. Eyes:     Pupils: Pupils are equal, round, and reactive to  light.  Neck:     Musculoskeletal: Full passive range of motion without pain and normal range of motion. Normal range of motion. No edema or neck rigidity.  Cardiovascular:     Rate and Rhythm: Normal rate.  Pulmonary:     Effort: No respiratory distress.  Abdominal:     General: There is no distension.  Musculoskeletal: Normal range of motion.  Skin:    General: Skin is warm and dry.  Neurological:     Mental Status: She is alert.      ED Treatments / Results  Labs (all labs ordered are listed, but only abnormal results are displayed) Labs Reviewed - No data to display  EKG None  Radiology No results found.  Procedures .Foreign Body Removal Date/Time: 10/04/2018 12:04 PM Performed by: Linwood Dibbles, PA-C Authorized by: Linwood Dibbles, PA-C  Consent: Verbal consent obtained. Written consent not obtained. Risks and benefits: risks, benefits and alternatives were discussed Consent given by: patient Patient understanding: patient states understanding of the procedure being performed Patient consent: the patient's understanding of the procedure matches consent given Procedure consent: procedure consent matches procedure scheduled Relevant documents: relevant documents present and verified Test results: test results available and properly labeled Site marked: the operative site was marked Patient identity confirmed: verbally with patient Body area: ear Location details: right ear  Sedation: Patient sedated: no  Patient restrained: no Patient cooperative: yes Localization method: ENT speculum Removal mechanism: irrigation Complexity: simple 1 objects recovered. Post-procedure assessment: foreign body removed Patient tolerance: Patient tolerated the procedure well with no immediate complications   (including critical care time)  Medications Ordered in ED Medications - No data to display   Initial Impression / Assessment and Plan / ED Course  I have  reviewed the triage vital signs and the nursing notes.  Pertinent labs & imaging results that were available during my care of the patient were reviewed by me and considered in my medical decision making (see chart for details).  32 year old female who appears otherwise well presents for evaluation of foreign body in right ear.  Patient states her children place something in her ear while she was sleeping.  Unknown what object is in her ear.  Afebrile, nonseptic, non-ill-appearing.  Does have mild pain which she rates a 3/10 to her ear with movement.  There is a white, shiny linear object in her right ear which appears to be lying against her tympanic membrane.  Object was visualized, however was not able to be obtained via alligator forceps due to depth of object.  Left ear normal without evidence of foreign  body.  Will try irrigation to remove object.  Object obtained via irrigation.  Object appears to be a 1 cm tip of a white plastic fork.  Right ear visualized after procedure without any evidence of perforation to tympanic membrane or retained foreign body.  Patient without pain, discharge or bleeding to ear.  No complications from procedure.  Discussed with patient follow-up with PCP for reevaluation.  Patient is hemodynamically stable and appropriate for DC home at this time.  Discussed return precautions.  Patient voiced understanding and is agreeable for follow-up.    Final Clinical Impressions(s) / ED Diagnoses   Final diagnoses:  Foreign body of right ear, initial encounter    ED Discharge Orders    None       Somya Jauregui A, PA-C 10/04/18 1213    Rolan BuccoBelfi, Melanie, MD 10/04/18 1343

## 2018-10-04 NOTE — ED Triage Notes (Signed)
Pt sts FB in RT ear

## 2018-10-04 NOTE — Discharge Instructions (Addendum)
You were evaluated today for a foreign body in your ear. This was removed.   Return to the ED with any new or worsening symptoms

## 2021-07-31 ENCOUNTER — Other Ambulatory Visit: Payer: Self-pay

## 2021-07-31 ENCOUNTER — Emergency Department (HOSPITAL_BASED_OUTPATIENT_CLINIC_OR_DEPARTMENT_OTHER)
Admission: EM | Admit: 2021-07-31 | Discharge: 2021-07-31 | Disposition: A | Discharge status: Home / Self Care | Payer: Self-pay | Attending: Emergency Medicine | Admitting: Emergency Medicine

## 2021-07-31 ENCOUNTER — Encounter (HOSPITAL_BASED_OUTPATIENT_CLINIC_OR_DEPARTMENT_OTHER): Payer: Self-pay

## 2021-07-31 DIAGNOSIS — L03012 Cellulitis of left finger: Secondary | ICD-10-CM

## 2021-07-31 MED ORDER — CLINDAMYCIN HCL 150 MG PO CAPS: 300.0000 mg | ORAL_CAPSULE | Freq: Three times a day (TID) | ORAL | 0 refills | Status: AC

## 2021-07-31 MED ORDER — BUPIVACAINE HCL (PF) 0.5 % IJ SOLN
10.0000 mL | Freq: Once | INTRAMUSCULAR | Status: AC
  Administered 2021-07-31: 10 mL
  Filled 2021-07-31: qty 10

## 2021-07-31 MED ORDER — CLINDAMYCIN HCL 150 MG PO CAPS
300.0000 mg | ORAL_CAPSULE | Freq: Once | ORAL | Status: AC
  Administered 2021-07-31: 300 mg via ORAL
  Filled 2021-07-31: qty 2

## 2021-07-31 NOTE — ED Notes (Signed)
ED Provider at bedside. 

## 2021-07-31 NOTE — Discharge Instructions (Addendum)
I am prescribing you an antibiotic called clindamycin.  Please take this 3 times a day for the next 5 days.  Do not stop taking this antibiotic early even if you feel that your symptoms have resolved.  Please continue to monitor your symptoms closely.  If you develop any new or worsening symptoms such as worsening pain, swelling, fevers, vomiting please return to the emergency department for further evaluation. It was a pleasure to meet you.

## 2021-07-31 NOTE — ED Triage Notes (Signed)
Swelling to left middle finger near nail.

## 2021-07-31 NOTE — ED Provider Notes (Signed)
[facility] EMERGENCY DEPARTMENT Provider Note   CSN: [id number] Arrival date & time: 07/31/21  1831     History Chief Complaint  Patient presents with   finger swelling   ingrown fingernail    Brooke Henderson is a 35 y.o. female.  HPI Patient is a 36 year old female who presents to the emergency department due to left middle finger pain and swelling.  Patient states her symptoms began about 4 days ago and are along the nail fold.  States her symptoms have been worsening.  No fevers or vomiting.  Reports a history of paronychia in the past requiring I&D.  States that she bites her nails regularly.    Past Medical History:  Diagnosis Date   Obese     Patient Active Problem List   Diagnosis Date Noted   Obesity complicating pregnancy, childbirth, or the puerperium, antepartum condition or complication(649.13) 06/01/2014    Past Surgical History:  Procedure Laterality Date   NO PAST SURGERIES       OB History     Gravida  5   Para  4   Term  4   Preterm  0   AB  0   Living  4      SAB  0   IAB  0   Ectopic  0   Multiple  0   Live Births  4           Family History  Problem Relation Age of Onset   Seizures Son    Hypertension Maternal Aunt    Hypertension Paternal Uncle    Diabetes Maternal Grandmother    Diabetes Maternal Grandfather    Diabetes Paternal Grandmother    Diabetes Paternal Grandfather     Social History   Tobacco Use   Smoking status: Never   Smokeless tobacco: Never  Vaping Use   Vaping Use: Never used  Substance Use Topics   Alcohol use: No   Drug use: No    Home Medications Prior to Admission medications   Medication Sig Start Date End Date Taking? Authorizing Provider  clindamycin (CLEOCIN) 150 MG capsule Take 2 capsules (300 mg total) by mouth 3 (three) times daily for 5 days. 07/31/21 08/05/21 Yes Placido Sou, PA-C  naproxen (NAPROSYN) 500 MG tablet Take 1 tablet (500 mg total) by mouth 2  (two) times daily. 10/18/17   Petrucelli, Samantha R, PA-C    Allergies    Penicillins  Review of Systems   Review of Systems  Constitutional:  Negative for chills and fever.  Gastrointestinal:  Negative for nausea and vomiting.  Musculoskeletal:  Positive for arthralgias and joint swelling.  Skin:  Positive for color change and rash.   Physical Exam Updated Vital Signs BP (!) 142/108 (BP Location: Left Arm)   Pulse 87   Temp 98.4 F (36.9 C) (Oral)   Resp 18   Ht 5\' 6"  (1.676 m)   Wt (!) 179.2 kg   SpO2 100%   BMI 63.75 kg/m   Physical Exam Vitals and nursing note reviewed.  Constitutional:      General: She is not in acute distress.    Appearance: She is well-developed.  HENT:     Head: Normocephalic and atraumatic.     Right Ear: External ear normal.     Left Ear: External ear normal.  Eyes:     General: No scleral icterus.       Right eye: No discharge.  Left eye: No discharge.     Conjunctiva/sclera: Conjunctivae normal.  Neck:     Trachea: No tracheal deviation.  Cardiovascular:     Rate and Rhythm: Normal rate.  Pulmonary:     Effort: Pulmonary effort is normal. No respiratory distress.     Breath sounds: No stridor.  Abdominal:     General: There is no distension.  Musculoskeletal:        General: Swelling and tenderness present. No deformity.     Cervical back: Neck supple.     Comments: Moderate tenderness with associated soft tissue swelling along the nail fold of the left middle finger.  Consistent with paronychia.  No significant tenderness noted along the palmar aspect of the finger.  2+ radial pulses.  Distal sensation intact.  Skin:    General: Skin is warm and dry.     Findings: No rash.  Neurological:     General: No focal deficit present.     Mental Status: She is alert and oriented to person, place, and time.     Cranial Nerves: Cranial nerve deficit: no gross deficits.   ED Results / Procedures / Treatments   Labs (all labs  ordered are listed, but only abnormal results are displayed) Labs Reviewed - No data to display  EKG None  Radiology No results found.  Procedures Drain paronychia  Date/Time: 07/31/2021 11:08 PM Performed by: Placido Sou, PA-C Authorized by: Placido Sou, PA-C  Consent: Verbal consent obtained. Risks and benefits: risks, benefits and alternatives were discussed Consent given by: patient Patient understanding: patient states understanding of the procedure being performed Patient consent: the patient's understanding of the procedure matches consent given Procedure consent: procedure consent matches procedure scheduled Relevant documents: relevant documents present and verified Test results: test results available and properly labeled Site marked: the operative site was marked Required items: required blood products, implants, devices, and special equipment available Patient identity confirmed: verbally with patient Time out: Immediately prior to procedure a "time out" was called to verify the correct patient, procedure, equipment, support staff and site/side marked as required. Preparation: Patient was prepped and draped in the usual sterile fashion. Local anesthesia used: yes Anesthesia: digital block  Anesthesia: Local anesthesia used: yes Local Anesthetic: bupivacaine 0.5% without epinephrine Anesthetic total: 3 mL  Sedation: Patient sedated: no  Patient tolerance: patient tolerated the procedure well with no immediate complications    Medications Ordered in ED Medications  clindamycin (CLEOCIN) capsule 300 mg (has no administration in time range)  bupivacaine (MARCAINE) 0.5 % injection 10 mL (10 mLs Infiltration Given by Other 07/31/21 2100)   ED Course  I have reviewed the triage vital signs and the nursing notes.  Pertinent labs & imaging results that were available during my care of the patient were reviewed by me and considered in my medical decision  making (see chart for details).    MDM Rules/Calculators/A&P                          Pt is a 35 y.o. female who presents to the emergency department due to what appears to be a paronychia to the left middle finger.  Patient notes a history of similar symptoms.  Patient states she bites her nails regularly.  Digital block performed successfully with Marcaine.  I&D performed of the region producing a moderate amount of purulent discharge.  Please see procedure note above for additional details.  Feel the patient is stable for discharge  at this time and she is agreeable.  Patient denies any systemic symptoms such as fevers, chills, nausea, or vomiting.  She is currently afebrile and not tachycardic.  Will discharge on a course of clindamycin.  First dose given in the emergency department.  We discussed return precautions.  Her questions were answered and she was amicable at the time of discharge.  Note: Portions of this report may have been transcribed using voice recognition software. Every effort was made to ensure accuracy; however, inadvertent computerized transcription errors may be present.   Final Clinical Impression(s) / ED Diagnoses Final diagnoses:  Paronychia of finger of left hand   Rx / DC Orders ED Discharge Orders          Ordered    clindamycin (CLEOCIN) 150 MG capsule  3 times daily        07/31/21 2300             Placido Sou, PA-C 07/31/21 2312    Milagros Loll, MD 08/01/21 1655

## 2023-05-15 ENCOUNTER — Other Ambulatory Visit: Payer: Self-pay

## 2023-05-15 ENCOUNTER — Emergency Department (HOSPITAL_BASED_OUTPATIENT_CLINIC_OR_DEPARTMENT_OTHER): Payer: Medicaid Other

## 2023-05-15 ENCOUNTER — Emergency Department (HOSPITAL_BASED_OUTPATIENT_CLINIC_OR_DEPARTMENT_OTHER)
Admission: EM | Admit: 2023-05-15 | Discharge: 2023-05-15 | Disposition: A | Discharge status: Home / Self Care | Payer: Medicaid Other | Attending: Emergency Medicine | Admitting: Emergency Medicine

## 2023-05-15 ENCOUNTER — Encounter (HOSPITAL_BASED_OUTPATIENT_CLINIC_OR_DEPARTMENT_OTHER): Payer: Self-pay

## 2023-05-15 DIAGNOSIS — Z79899 Other long term (current) drug therapy: Secondary | ICD-10-CM | POA: Diagnosis not present

## 2023-05-15 DIAGNOSIS — E876 Hypokalemia: Secondary | ICD-10-CM | POA: Insufficient documentation

## 2023-05-15 DIAGNOSIS — R079 Chest pain, unspecified: Secondary | ICD-10-CM | POA: Insufficient documentation

## 2023-05-15 DIAGNOSIS — I10 Essential (primary) hypertension: Secondary | ICD-10-CM | POA: Insufficient documentation

## 2023-05-15 HISTORY — DX: Essential (primary) hypertension: I10

## 2023-05-15 LAB — I-STAT CHEM 8, ED
BUN: 7 mg/dL (ref 6–20)
Calcium, Ion: 1.1 mmol/L — ABNORMAL LOW (ref 1.15–1.40)
Chloride: 102 mmol/L (ref 98–111)
Creatinine, Ser: 0.8 mg/dL (ref 0.44–1.00)
Glucose, Bld: 97 mg/dL (ref 70–99)
HCT: 35 % — ABNORMAL LOW (ref 36.0–46.0)
Hemoglobin: 11.9 g/dL — ABNORMAL LOW (ref 12.0–15.0)
Potassium: 3.3 mmol/L — ABNORMAL LOW (ref 3.5–5.1)
Sodium: 139 mmol/L (ref 135–145)
TCO2: 24 mmol/L (ref 22–32)

## 2023-05-15 LAB — BASIC METABOLIC PANEL
Anion gap: 9 (ref 5–15)
BUN: 9 mg/dL (ref 6–20)
CO2: 25 mmol/L (ref 22–32)
Calcium: 8.7 mg/dL — ABNORMAL LOW (ref 8.9–10.3)
Chloride: 103 mmol/L (ref 98–111)
Creatinine, Ser: 0.76 mg/dL (ref 0.44–1.00)
GFR, Estimated: >60 mL/min (ref >60)
Glucose, Bld: 101 mg/dL — ABNORMAL HIGH (ref 70–99)
Potassium: 3.4 mmol/L — ABNORMAL LOW (ref 3.5–5.1)
Sodium: 137 mmol/L (ref 135–145)

## 2023-05-15 LAB — CBC
HCT: 34.8 % — ABNORMAL LOW (ref 36.0–46.0)
Hemoglobin: 10.5 g/dL — ABNORMAL LOW (ref 12.0–15.0)
MCH: 24.5 pg — ABNORMAL LOW (ref 26.0–34.0)
MCHC: 30.2 g/dL (ref 30.0–36.0)
MCV: 81.1 fL (ref 80.0–100.0)
Platelets: 270 10*3/uL (ref 150–400)
RBC: 4.29 MIL/uL (ref 3.87–5.11)
RDW: 16.7 % — ABNORMAL HIGH (ref 11.5–15.5)
WBC: 5.7 10*3/uL (ref 4.0–10.5)
nRBC: 0 % (ref 0.0–0.2)

## 2023-05-15 LAB — PREGNANCY, URINE: Preg Test, Ur: NEGATIVE

## 2023-05-15 LAB — TROPONIN I (HIGH SENSITIVITY): Troponin I (High Sensitivity): 6 ng/L (ref <18)

## 2023-05-15 MED ORDER — POTASSIUM CHLORIDE CRYS ER 20 MEQ PO TBCR
40.0000 meq | EXTENDED_RELEASE_TABLET | Freq: Once | ORAL | Status: AC
  Administered 2023-05-15: 40 meq via ORAL
  Filled 2023-05-15: qty 2

## 2023-05-15 MED ORDER — LOSARTAN POTASSIUM 50 MG PO TABS: 50.0000 mg | ORAL_TABLET | Freq: Every day | ORAL | 3 refills | Status: DC

## 2023-05-15 MED ORDER — LOSARTAN POTASSIUM 25 MG PO TABS
50.0000 mg | ORAL_TABLET | Freq: Once | ORAL | Status: AC
  Administered 2023-05-15: 50 mg via ORAL
  Filled 2023-05-15: qty 2

## 2023-05-15 NOTE — ED Triage Notes (Signed)
Pt arrives with c/o chest pain that started today. Per pt, she is having CP due to her not taking her BP meds. Per pt, BP meds were not working and she is trying to find a new doctor. Pt has not been on BP meds for about 2 months.

## 2023-05-15 NOTE — ED Provider Notes (Signed)
[facility] Provider Note   CSN: [id number] Arrival date & time: 05/15/23  1957     History  Chief Complaint  Patient presents with   Chest Pain    Brooke Henderson is a 37 y.o. female with past medical history hypertension who presents to the ED with various complaints.  She states that for the last 2 months she has had intermittent chest pain, headaches, blurred vision, and shortness of breath.  She attributes this to being off her antihypertensives for the last 2 months.  She states that symptoms are getting worse.  States that she was previously on amlodipine but did not have good blood pressure control on this medication.  States that she was also on a different medication previously, suspected to be losartan, and had good blood pressure control with this.  No nausea, vomiting, abdominal pain, fever, chills, or recent infections.  States that she mainly came to the ED today to get her blood pressure under better control.  States that she was previously followed at the local free clinic but recently obtained insurance and is planning on establishing a primary care provider.  No personal or family history of heart disease.      Home Medications Prior to Admission medications   Medication Sig Start Date End Date Taking? Authorizing Provider  losartan (COZAAR) 50 MG tablet Take 1 tablet (50 mg total) by mouth daily. 05/15/23 08/13/23 Yes Edon Hoadley L, PA-C  naproxen (NAPROSYN) 500 MG tablet Take 1 tablet (500 mg total) by mouth 2 (two) times daily. 10/18/17   Petrucelli, Samantha R, PA-C      Allergies    Amoxicillin and Penicillins    Review of Systems   Review of Systems  All other systems reviewed and are negative.   Physical Exam Updated Vital Signs BP (!) 152/85   Pulse 82   Temp 98.1 F (36.7 C) (Oral)   Resp 19   Wt (!) 186.9 kg   SpO2 99%   BMI 66.50 kg/m  Physical Exam Vitals and nursing note reviewed.   Constitutional:      General: She is not in acute distress.    Appearance: Normal appearance. She is obese. She is not ill-appearing, toxic-appearing or diaphoretic.  HENT:     Head: Normocephalic and atraumatic.     Mouth/Throat:     Mouth: Mucous membranes are moist.  Eyes:     Extraocular Movements: Extraocular movements intact.     Conjunctiva/sclera: Conjunctivae normal.     Pupils: Pupils are equal, round, and reactive to light.  Neck:     Vascular: No JVD.  Cardiovascular:     Rate and Rhythm: Normal rate and regular rhythm.     Heart sounds: No murmur heard.    No S3 or S4 sounds.  Pulmonary:     Effort: Pulmonary effort is normal.     Breath sounds: Normal breath sounds.  Abdominal:     General: Abdomen is flat.     Palpations: Abdomen is soft.     Tenderness: There is no abdominal tenderness.  Musculoskeletal:        General: Normal range of motion.     Cervical back: Normal range of motion and neck supple.     Right lower leg: No tenderness. No edema.     Left lower leg: No tenderness. No edema.  Skin:    General: Skin is warm and dry.     Capillary Refill: Capillary refill  takes less than 2 seconds.  Neurological:     General: No focal deficit present.     Mental Status: She is alert and oriented to person, place, and time. Mental status is at baseline.  Psychiatric:        Behavior: Behavior normal.     ED Results / Procedures / Treatments   Labs (all labs ordered are listed, but only abnormal results are displayed) Labs Reviewed  BASIC METABOLIC PANEL - Abnormal; Notable for the following components:      Result Value   Potassium 3.4 (*)    Glucose, Bld 101 (*)    Calcium 8.7 (*)    All other components within normal limits  CBC - Abnormal; Notable for the following components:   Hemoglobin 10.5 (*)    HCT 34.8 (*)    MCH 24.5 (*)    RDW 16.7 (*)    All other components within normal limits  I-STAT CHEM 8, ED - Abnormal; Notable for the  following components:   Potassium 3.3 (*)    Calcium, Ion 1.10 (*)    Hemoglobin 11.9 (*)    HCT 35.0 (*)    All other components within normal limits  PREGNANCY, URINE  TROPONIN I (HIGH SENSITIVITY)    EKG None  Radiology DG Chest 2 View  Result Date: 05/15/2023 CLINICAL DATA:  Chest pain EXAM: CHEST - 2 VIEW COMPARISON:  07/25/2022 FINDINGS: The heart size and mediastinal contours are within normal limits. Both lungs are clear. The visualized skeletal structures are unremarkable. IMPRESSION: No active cardiopulmonary disease. Electronically Signed   By: Alcide Clever M.D.   On: 05/15/2023 20:33    Procedures Procedures    Medications Ordered in ED Medications  potassium chloride SA (KLOR-CON M) CR tablet 40 mEq (has no administration in time range)  losartan (COZAAR) tablet 50 mg (50 mg Oral Given 05/15/23 2218)    ED Course/ Medical Decision Making/ A&P                            Medical Decision Making Amount and/or Complexity of Data Reviewed Labs: ordered. Decision-making details documented in ED Course. Radiology: ordered. Decision-making details documented in ED Course. ECG/medicine tests: ordered. Decision-making details documented in ED Course.  Risk Prescription drug management.   Medical Decision Making:   Brooke Henderson is a 37 y.o. female who presented to the ED today with chest pain detailed above.    Additional history discussed with patient's family/caregivers.  Patient's presentation is complicated by their history of untreated HTN.  Complete initial physical exam performed, notably the patient  was in no acute distress.  Regular rate and rhythm, lungs clear to auscultation.  Abdomen soft and nontender.  No lower extremity edema.  No JVD.    Reviewed and confirmed nursing documentation for past medical history, family history, social history.    Initial Assessment:   With the patient's presentation of chest pain, the emergent differential diagnosis of  chest pain includes: Acute coronary syndrome, pericarditis, aortic dissection, pulmonary embolism, tension pneumothorax, and esophageal rupture. Other urgent/non-acute considerations include, but are not limited to: chronic angina, aortic stenosis, cardiomyopathy, myocarditis, mitral valve prolapse, pulmonary hypertension, hypertrophic obstructive cardiomyopathy (HOCM), aortic insufficiency, right ventricular hypertrophy, pneumonia, pleuritis, bronchitis, pneumothorax, tumor, gastroesophageal reflux disease (GERD), esophageal spasm, Mallory-Weiss syndrome, peptic ulcer disease, biliary disease, pancreatitis, functional gastrointestinal pain, cervical or thoracic disk disease or arthritis, shoulder arthritis, costochondritis, subacromial bursitis, anxiety or panic attack,  herpes zoster, breast disorders, chest wall tumors, thoracic outlet syndrome, mediastinitis.    Initial Plan:  Screening labs including CBC and Metabolic panel to evaluate for infectious or metabolic etiology of disease.  CXR to evaluate for structural/infectious intrathoracic pathology.  EKG and troponin to evaluate for cardiac pathology Objective evaluation as reviewed   Initial Study Results:   Laboratory  All laboratory results reviewed without evidence of clinically relevant pathology.   Exceptions include: K3.4, calcium 8.7, hemoglobin 10.5  EKG EKG was reviewed independently. ST segments without concerns for elevations.   EKG: normal sinus rhythm.   Radiology:  All images reviewed independently. Agree with radiology report at this time.   DG Chest 2 View  Result Date: 05/15/2023 CLINICAL DATA:  Chest pain EXAM: CHEST - 2 VIEW COMPARISON:  07/25/2022 FINDINGS: The heart size and mediastinal contours are within normal limits. Both lungs are clear. The visualized skeletal structures are unremarkable. IMPRESSION: No active cardiopulmonary disease. Electronically Signed   By: Alcide Clever M.D.   On: 05/15/2023 20:33      Final Assessment and Plan:   37 year old female presents to the ED with multiple symptoms as above, most notably chest pain and headache.  Attributes this to uncontrolled blood pressure.  She has been off her antihypertensives for the last 2 months.  Initial BP 178/99.  Says that she was followed by a local clinic and prescribed amlodipine but this did not work for her.  Previously on a different medication, likely losartan, with good blood pressure control.  States that symptoms do seem to be getting worse but no recent acute change.  Neurologically intact.  EKG normal sinus rhythm without acute ST-T changes.  Low suspicion for ACS.  Workup initiated as above for further assessment.  Pregnancy test negative.  Kidney function normal, troponin normal.  Chest x-ray normal.  Overall reassuring workup apart from minor hypokalemia.  No evidence of endorgan damage. Given dose of losartan with good blood pressure improvement.  Will send patient home on this.  Instructed to monitor BP at home and hold medication if indicated.  Stressed the importance of close primary care follow-up and patient agreeable to establish PCP.  Given strict ED return precautions, all questions answered, and stable for discharge.   Clinical Impression:  1. Chest pain, unspecified type   2. Elevated blood pressure reading with diagnosis of hypertension   3. Hypokalemia      Discharge           Final Clinical Impression(s) / ED Diagnoses Final diagnoses:  Elevated blood pressure reading with diagnosis of hypertension  Chest pain, unspecified type  Hypokalemia    Rx / DC Orders ED Discharge Orders          Ordered    losartan (COZAAR) 50 MG tablet  Daily        05/15/23 2241              Tonette Lederer, PA-C 05/15/23 2249    Virgina Norfolk, DO 05/15/23 2256

## 2023-05-15 NOTE — Discharge Instructions (Addendum)
Thank you for letting us take care of you today.  Overall, your workup was reassuring.  Your EKG, chest x-ray, and heart enzymes are normal.  Your kidney function was normal.  Other than a slightly low potassium, your blood work was reassuring.  We gave you a dose of a medication called losartan with improvement in her blood pressure.  I am prescribing this for home.  I recommend taking your blood pressure once daily around the same time and keeping a log of this.  You can purchase a blood pressure cuff at any pharmacy.  If your blood pressure is elevated, proceed with taking the medication as prescribed.  If it is lower than 140/90, I recommend holding the medication unless otherwise instructed by your future PCP.  I provided the family medicine clinic for you to follow-up with.  You can also call your insurance company to find out what PCPs accept your insurance.  For new or worsening symptoms, return to the nearest ED for reevaluation.

## 2023-08-16 ENCOUNTER — Other Ambulatory Visit: Payer: Self-pay

## 2023-08-16 ENCOUNTER — Encounter (HOSPITAL_BASED_OUTPATIENT_CLINIC_OR_DEPARTMENT_OTHER): Payer: Self-pay

## 2023-08-16 ENCOUNTER — Emergency Department (HOSPITAL_BASED_OUTPATIENT_CLINIC_OR_DEPARTMENT_OTHER)
Admission: EM | Admit: 2023-08-16 | Discharge: 2023-08-16 | Disposition: A | Discharge status: Home / Self Care | Payer: Medicaid Other | Attending: Emergency Medicine | Admitting: Emergency Medicine

## 2023-08-16 ENCOUNTER — Emergency Department (HOSPITAL_BASED_OUTPATIENT_CLINIC_OR_DEPARTMENT_OTHER): Payer: Medicaid Other

## 2023-08-16 DIAGNOSIS — D649 Anemia, unspecified: Secondary | ICD-10-CM | POA: Insufficient documentation

## 2023-08-16 DIAGNOSIS — I10 Essential (primary) hypertension: Secondary | ICD-10-CM | POA: Diagnosis not present

## 2023-08-16 DIAGNOSIS — R0789 Other chest pain: Secondary | ICD-10-CM | POA: Diagnosis present

## 2023-08-16 DIAGNOSIS — Z79899 Other long term (current) drug therapy: Secondary | ICD-10-CM | POA: Insufficient documentation

## 2023-08-16 LAB — BASIC METABOLIC PANEL
Anion gap: 11 (ref 5–15)
BUN: 12 mg/dL (ref 6–20)
CO2: 27 mmol/L (ref 22–32)
Calcium: 8.5 mg/dL — ABNORMAL LOW (ref 8.9–10.3)
Chloride: 100 mmol/L (ref 98–111)
Creatinine, Ser: 0.74 mg/dL (ref 0.44–1.00)
GFR, Estimated: >60 mL/min (ref >60)
Glucose, Bld: 94 mg/dL (ref 70–99)
Potassium: 3.4 mmol/L — ABNORMAL LOW (ref 3.5–5.1)
Sodium: 138 mmol/L (ref 135–145)

## 2023-08-16 LAB — TROPONIN I (HIGH SENSITIVITY): Troponin I (High Sensitivity): 5 ng/L (ref <18)

## 2023-08-16 LAB — CBC
HCT: 34.7 % — ABNORMAL LOW (ref 36.0–46.0)
Hemoglobin: 10.6 g/dL — ABNORMAL LOW (ref 12.0–15.0)
MCH: 24.8 pg — ABNORMAL LOW (ref 26.0–34.0)
MCHC: 30.5 g/dL (ref 30.0–36.0)
MCV: 81.3 fL (ref 80.0–100.0)
Platelets: 320 10*3/uL (ref 150–400)
RBC: 4.27 MIL/uL (ref 3.87–5.11)
RDW: 16 % — ABNORMAL HIGH (ref 11.5–15.5)
WBC: 7.7 10*3/uL (ref 4.0–10.5)
nRBC: 0 % (ref 0.0–0.2)

## 2023-08-16 LAB — PREGNANCY, URINE: Preg Test, Ur: NEGATIVE

## 2023-08-16 NOTE — Discharge Instructions (Addendum)
It was a pleasure caring for you today.  Workup was reassuring.  Please follow-up with your primary care provider.  Seek emergency care if experiencing any new or worsening symptoms.

## 2023-08-16 NOTE — ED Triage Notes (Addendum)
Pt presents CP, ShOB, elevated BP today. EKG delayed due to pt yelling on the phone to family during triage.

## 2023-08-16 NOTE — ED Provider Notes (Signed)
[place] [facility] Provider Note   CSN: [id number] Arrival date & time: 08/16/23  1849     History  Chief Complaint  Patient presents with   Chest Pain    Brooke Henderson is a 37 y.o. female with PMHx HTN who presents to ED concerned for intermittent left sided chest "pressure", SOB, HTN x2 weeks. Stating that it is stress related, but also concerned because her BP was elevated today and she was unable to access her BP medication. 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 Stating that SBP in 170's is actually good for her since SBP is usually in the 190's.   Denies fever, dyspnea, cough, nausea, vomiting, diarrhea. Denies recent surgery/immobilization, hx DT/PE, hemoptysis, hx cancer in the past 6 months, calf swelling/tenderness. Denies birth control and TOB use.    Chest Pain      Home Medications Prior to Admission medications   Medication Sig Start Date End Date Taking? Authorizing Provider  losartan (COZAAR) 50 MG tablet Take 1 tablet (50 mg total) by mouth daily. 05/15/23 08/13/23  Gowens, Mariah L, PA-C  naproxen (NAPROSYN) 500 MG tablet Take 1 tablet (500 mg total) by mouth 2 (two) times daily. 10/18/17   Petrucelli, Samantha R, PA-C      Allergies    Amoxicillin and Penicillins    Review of Systems   Review of Systems  Cardiovascular:  Positive for chest pain.    Physical Exam Updated Vital Signs BP 128/67 (BP Location: Right Arm)   Pulse 78   Temp 98 F (36.7 C) (Oral)   Resp 20   Ht 5' 5.25" (1.657 m)   Wt (!) 186 kg   LMP 08/07/2023   SpO2 99%   BMI 67.71 kg/m  Physical Exam Vitals and nursing note reviewed.  Constitutional:      General: She is not in acute distress.    Appearance: She is not ill-appearing or toxic-appearing.  HENT:     Head: Normocephalic and atraumatic.     Mouth/Throat:     Mouth: Mucous membranes are moist.      Pharynx: No oropharyngeal exudate or posterior oropharyngeal erythema.  Eyes:     General: No scleral icterus.       Right eye: No discharge.        Left eye: No discharge.     Conjunctiva/sclera: Conjunctivae normal.  Cardiovascular:     Rate and Rhythm: Normal rate and regular rhythm.     Pulses: Normal pulses.          Radial pulses are 2+ on the right side and 2+ on the left side.     Heart sounds: Normal heart sounds. No murmur heard. Pulmonary:     Effort: Pulmonary effort is normal. No respiratory distress.     Breath sounds: Normal breath sounds. No wheezing, rhonchi or rales.  Abdominal:     General: Bowel sounds are normal.     Palpations: Abdomen is soft.  Musculoskeletal:     Right lower leg: No edema.     Left lower leg: No edema.     Comments: No calf tenderness to palpation  Skin:    General: Skin is warm and dry.     Findings: No rash.  Neurological:     General: No focal deficit present.     Mental Status: She is alert and oriented to person, place, and time.  Mental status is at baseline.  Psychiatric:        Mood and Affect: Mood normal.        Behavior: Behavior normal.     ED Results / Procedures / Treatments   Labs (all labs ordered are listed, but only abnormal results are displayed) Labs Reviewed  BASIC METABOLIC PANEL - Abnormal; Notable for the following components:      Result Value   Potassium 3.4 (*)    Calcium 8.5 (*)    All other components within normal limits  CBC - Abnormal; Notable for the following components:   Hemoglobin 10.6 (*)    HCT 34.7 (*)    MCH 24.8 (*)    RDW 16.0 (*)    All other components within normal limits  PREGNANCY, URINE  TROPONIN I (HIGH SENSITIVITY)  TROPONIN I (HIGH SENSITIVITY)    EKG None  Radiology DG Chest 2 View  Result Date: 08/16/2023 CLINICAL DATA:  Chest pain and shortness of breath EXAM: CHEST - 2 VIEW COMPARISON:  May 15, 2023 FINDINGS: The cardiomediastinal silhouette is unchanged and  the upper limits of normal in contour. No pleural effusion. No pneumothorax. No acute pleuroparenchymal abnormality. Visualized abdomen is unremarkable. No acute osseous abnormality. IMPRESSION: No acute cardiopulmonary abnormality. Electronically Signed   By: Meda Klinefelter M.D.   On: 08/16/2023 20:24    Procedures Procedures    Medications Ordered in ED Medications - No data to display  ED Course/ Medical Decision Making/ A&P                                 Medical Decision Making Amount and/or Complexity of Data Reviewed Labs: ordered. Radiology: ordered.   This patient presents to the ED for concern of chest pain, SOB, HTN, this involves an extensive number of treatment options, and is a complaint that carries with it a high risk of complications and morbidity.  The differential diagnosis includes stroke/ICH, CHF, kidney complications, acute aortic dissection, ACS, hypertensive emergency   Co morbidities that complicate the patient evaluation  HTN   Additional history obtained:  Patient seen in ED earlier this month for chest pain with reassuring workup - patient stating that symptoms are more mild today   Lab Tests:  I Ordered, and personally interpreted labs.  The pertinent results include:   - UPT: negative - Troponin: 5 - CBC: anemia at 10.6; no leukocytosis - BMP: no concern for electrolyte abnormality; no concern for kidney damage   Imaging Studies ordered:  I ordered imaging studies including  -chest xray: to assess for process contributing to patient's symptoms I independently visualized and interpreted imaging I agree with the radiologist interpretation   Cardiac Monitoring: / EKG:  The patient was maintained on a cardiac monitor.  I personally viewed and interpreted the cardiac monitored which showed an underlying rhythm of: sinus rhythm without acute ST changes or arrythmias  Problem List / ED Course / Critical interventions / Medication  management  Patient presents to ED concerned for intermittent chest pain, SOB, and HTN x2 weeks. States that she has not taken amlodipine recently because pharmacy was closed today. Denying any other infectious symptoms today. Physical exam unremarkable. Patient afebrile with stable vitals. Patient did have HTN in ED that resolved without medical intervention. PERC score 0. Patient without red flags of HTN today such as headache, blurred vision, dizziness. Physical exam without concern for leg edema, diaphoresis or  neuro deficits. Lab workup without concern for severe hypokalemia or hypercalcemia. CBC without leukocytosis - there is anemia at patient's baseline. BMP reassuring. Troponin is in normal limits. UPT negative. Chest xray without acute abnormality. EKG reassuring. Shared results with patient and recommended following up with PCP. Patient verbalized understanding of plan. Patient states that she feels good and is ready for discharge. I have reviewed the patients home medicines and have made adjustments as needed Patient afebrile with stable vitals.  Patient ready for discharge.  Provided patient with strict return precautions.   Ddx: these are considered less likely due to history of present illness and physical exam findings -stroke/ICH: without neurodeficits; no recent head trauma -CHF: no leg edema or JVD on physical exam -kidney complications: BUN/Cr reassuring -ACS: EKG and troponin reassuring -hypertensive emergency: denies red flag HTN symptoms currently -Pneumonia: lungs are clear to auscultation bilaterally -Pulmonary embolism: no recent surgeries, blood clot hx, hemoptysis, cancer hx, vitals stable -Pneumothorax: lungs are clear to auscultation bilaterally   Social Determinants of Health:  none           Final Clinical Impression(s) / ED Diagnoses Final diagnoses:  Other chest pain    Rx / DC Orders ED Discharge Orders     None         Dorthy Cooler, [place] 08/16/23 2333    Anders Simmonds T, DO 08/18/23 1540

## 2023-10-13 ENCOUNTER — Other Ambulatory Visit: Payer: Self-pay

## 2023-10-13 ENCOUNTER — Emergency Department (HOSPITAL_BASED_OUTPATIENT_CLINIC_OR_DEPARTMENT_OTHER)
Admission: EM | Admit: 2023-10-13 | Discharge: 2023-10-13 | Disposition: A | Discharge status: Home / Self Care | Payer: Medicaid Other | Attending: Emergency Medicine | Admitting: Emergency Medicine

## 2023-10-13 ENCOUNTER — Encounter (HOSPITAL_BASED_OUTPATIENT_CLINIC_OR_DEPARTMENT_OTHER): Payer: Self-pay

## 2023-10-13 DIAGNOSIS — Z202 Contact with and (suspected) exposure to infections with a predominantly sexual mode of transmission: Secondary | ICD-10-CM | POA: Insufficient documentation

## 2023-10-13 DIAGNOSIS — Z79899 Other long term (current) drug therapy: Secondary | ICD-10-CM | POA: Diagnosis not present

## 2023-10-13 DIAGNOSIS — I10 Essential (primary) hypertension: Secondary | ICD-10-CM | POA: Insufficient documentation

## 2023-10-13 LAB — URINALYSIS, ROUTINE W REFLEX MICROSCOPIC
Bilirubin Urine: NEGATIVE
Glucose, UA: NEGATIVE mg/dL
Hgb urine dipstick: NEGATIVE
Ketones, ur: NEGATIVE mg/dL
Leukocytes,Ua: NEGATIVE
Nitrite: NEGATIVE
Protein, ur: NEGATIVE mg/dL
Specific Gravity, Urine: 1.025 (ref 1.005–1.030)
pH: 6.5 (ref 5.0–8.0)

## 2023-10-13 LAB — PREGNANCY, URINE: Preg Test, Ur: NEGATIVE

## 2023-10-13 MED ORDER — CEFTRIAXONE SODIUM 1 G IJ SOLR
1.0000 g | Freq: Once | INTRAMUSCULAR | Status: AC
  Administered 2023-10-13: 1 g via INTRAMUSCULAR
  Filled 2023-10-13: qty 10

## 2023-10-13 MED ORDER — DOXYCYCLINE HYCLATE 100 MG PO TABS
100.0000 mg | ORAL_TABLET | Freq: Once | ORAL | Status: AC
  Administered 2023-10-13: 100 mg via ORAL
  Filled 2023-10-13: qty 1

## 2023-10-13 MED ORDER — DOXYCYCLINE HYCLATE 100 MG PO CAPS: 100.0000 mg | ORAL_CAPSULE | Freq: Two times a day (BID) | ORAL | 0 refills | Status: AC

## 2023-10-13 NOTE — Discharge Instructions (Signed)
As discussed, I have sent a prescription of doxycycline to your pharmacy.  Take this twice a day for the next 7 days.  Take this medication with food as it can cause nauseousness if taken on empty stomach.  You will receive a call from the hospital and neck several days if any of your testing comes back positive, you will need to return to the hospital at that time to receive further treatment.  Follow-up with your primary care provider within the next 5 to 7 days for reevaluation of your symptoms.  Return to the ED if you develop severe abdominal pain or persistent vaginal bleeding in the interim.

## 2023-10-13 NOTE — ED Triage Notes (Signed)
Pt want a screening for STD after being in a situation. Denies any discharge or odor.

## 2023-10-13 NOTE — ED Provider Notes (Signed)
[place] [facility] AT [facility] Provider Note   CSN: [id number] Arrival date & time: 10/13/23  1846     History  Chief Complaint  Patient presents with   STI testing    Testing     Brooke Henderson is a 37 y.o. female with a history of hypertension and obesity who presents the ED today for STD testing.  Patient reports she was in a "situation" 4 days ago and is requesting to get tested for STDs to be safe.  She does not wish to elaborate on the incident as does not want any further action to be taken. She denies any vaginal lesions, pain, discharge, or odor.  She did have some spotting after the event but has not had it since.  Denies abdominal pain or back pain.  No additional complaints or concerns at this time.   Home Medications Prior to Admission medications   Medication Sig Start Date End Date Taking? Authorizing Provider  doxycycline (VIBRAMYCIN) 100 MG capsule Take 1 capsule (100 mg total) by mouth 2 (two) times daily for 7 days. 10/13/23 10/20/23 Yes Maxwell Marion, PA-C  losartan (COZAAR) 50 MG tablet Take 1 tablet (50 mg total) by mouth daily. 05/15/23 08/13/23  Gowens, Mariah L, PA-C  naproxen (NAPROSYN) 500 MG tablet Take 1 tablet (500 mg total) by mouth 2 (two) times daily. 10/18/17   Petrucelli, Samantha R, PA-C      Allergies    Amoxicillin and Penicillins    Review of Systems   Review of Systems  Genitourinary:        Requesting STD testing  All other systems reviewed and are negative.   Physical Exam Updated Vital Signs BP (!) 166/95 (BP Location: Right Arm)   Pulse 87   Temp 98.3 F (36.8 C) (Oral)   Resp 18   Ht 5\' 5"  (1.651 m)   Wt (!) 186 kg   LMP 10/10/2023   SpO2 97%   BMI 68.24 kg/m  Physical Exam Vitals and nursing note reviewed.  Constitutional:      General: She is not in acute distress.    Appearance: Normal appearance.  HENT:     Head: Normocephalic and atraumatic.     Mouth/Throat:     Mouth: Mucous  membranes are moist.  Eyes:     Conjunctiva/sclera: Conjunctivae normal.     Pupils: Pupils are equal, round, and reactive to light.  Cardiovascular:     Rate and Rhythm: Normal rate and regular rhythm.     Pulses: Normal pulses.     Heart sounds: Normal heart sounds.  Pulmonary:     Effort: Pulmonary effort is normal.     Breath sounds: Normal breath sounds.  Abdominal:     Palpations: Abdomen is soft.     Tenderness: There is no abdominal tenderness.  Skin:    General: Skin is warm and dry.     Findings: No rash.  Neurological:     General: No focal deficit present.     Mental Status: She is alert.  Psychiatric:        Mood and Affect: Mood normal.        Behavior: Behavior normal.    ED Results / Procedures / Treatments   Labs (all labs ordered are listed, but only abnormal results are displayed) Labs Reviewed  PREGNANCY, URINE  URINALYSIS, ROUTINE W REFLEX MICROSCOPIC  RPR  HIV ANTIBODY (ROUTINE TESTING W REFLEX)  GC/CHLAMYDIA PROBE AMP ([place]) NOT AT [facility]  EKG None  Radiology No results found.  Procedures Procedures: not indicated.   Medications Ordered in ED Medications  cefTRIAXone (ROCEPHIN) injection 1 g (1 g Intramuscular Given 10/13/23 2046)  doxycycline (VIBRA-TABS) tablet 100 mg (100 mg Oral Given 10/13/23 2046)    ED Course/ Medical Decision Making/ A&P                                 Medical Decision Making Amount and/or Complexity of Data Reviewed Labs: ordered.  Risk Prescription drug management.   This patient presents to the ED for concern of STI, this involves an extensive number of treatment options, and is a complaint that carries with it a high risk of complications and morbidity.   Differential diagnosis includes: gonorrhea, chlamydia, syphilis, HIV, UTI, etc.   Comorbidities  See HPI above   Additional History  Additional history obtained from prior records.   Lab Tests  I ordered and personally  interpreted labs.  The pertinent results include:   Negative pregnancy test Urine is negative for infection GC/chlamydia, HIV, and syphilis testing pending.    Problem List / ED Course / Critical Interventions / Medication Management  STI testing Patient wishes to be treated prophylactically to be safe. I ordered medications including: Rocephin and Doxycycline for gonorrhea and chlamydia treatment I have reviewed the patients home medicines and have made adjustments as needed Patient endorses good social support and feels safe going home.   Social Determinants of Health  Social connection   Test / Admission - Considered  Discussed findings with patient.   She is hemodynamically stable and safe for discharge home. Return precautions given.       Final Clinical Impression(s) / ED Diagnoses Final diagnoses:  Possible exposure to STI    Rx / DC Orders ED Discharge Orders          Ordered    doxycycline (VIBRAMYCIN) 100 MG capsule  2 times daily        10/13/23 2038              Maxwell Marion, PA-C 10/13/23 2136    Rolan Bucco, MD 10/13/23 2337

## 2023-10-14 LAB — GC/CHLAMYDIA PROBE AMP (~~LOC~~) NOT AT ARMC
Chlamydia: NEGATIVE
Comment: NEGATIVE
Comment: NORMAL
Neisseria Gonorrhea: NEGATIVE

## 2023-10-14 LAB — RPR: RPR Ser Ql: NONREACTIVE

## 2023-10-14 LAB — HIV ANTIBODY (ROUTINE TESTING W REFLEX): HIV Screen 4th Generation wRfx: NONREACTIVE

## 2023-11-14 ENCOUNTER — Emergency Department (HOSPITAL_BASED_OUTPATIENT_CLINIC_OR_DEPARTMENT_OTHER)
Admission: EM | Admit: 2023-11-14 | Discharge: 2023-11-14 | Disposition: A | Discharge status: Home / Self Care | Payer: Medicaid Other | Attending: Emergency Medicine | Admitting: Emergency Medicine

## 2023-11-14 ENCOUNTER — Encounter (HOSPITAL_BASED_OUTPATIENT_CLINIC_OR_DEPARTMENT_OTHER): Payer: Self-pay | Admitting: Emergency Medicine

## 2023-11-14 ENCOUNTER — Emergency Department (HOSPITAL_BASED_OUTPATIENT_CLINIC_OR_DEPARTMENT_OTHER): Payer: Medicaid Other

## 2023-11-14 ENCOUNTER — Other Ambulatory Visit (HOSPITAL_BASED_OUTPATIENT_CLINIC_OR_DEPARTMENT_OTHER): Payer: Self-pay

## 2023-11-14 ENCOUNTER — Other Ambulatory Visit: Payer: Self-pay

## 2023-11-14 DIAGNOSIS — I10 Essential (primary) hypertension: Secondary | ICD-10-CM | POA: Insufficient documentation

## 2023-11-14 DIAGNOSIS — Z79899 Other long term (current) drug therapy: Secondary | ICD-10-CM | POA: Diagnosis not present

## 2023-11-14 DIAGNOSIS — E876 Hypokalemia: Secondary | ICD-10-CM | POA: Diagnosis not present

## 2023-11-14 DIAGNOSIS — R079 Chest pain, unspecified: Secondary | ICD-10-CM | POA: Diagnosis present

## 2023-11-14 LAB — BASIC METABOLIC PANEL
Anion gap: 10 (ref 5–15)
BUN: 8 mg/dL (ref 6–20)
CO2: 31 mmol/L (ref 22–32)
Calcium: 9.1 mg/dL (ref 8.9–10.3)
Chloride: 97 mmol/L — ABNORMAL LOW (ref 98–111)
Creatinine, Ser: 0.7 mg/dL (ref 0.44–1.00)
GFR, Estimated: >60 mL/min (ref >60)
Glucose, Bld: 102 mg/dL — ABNORMAL HIGH (ref 70–99)
Potassium: 3.1 mmol/L — ABNORMAL LOW (ref 3.5–5.1)
Sodium: 138 mmol/L (ref 135–145)

## 2023-11-14 LAB — HCG, SERUM, QUALITATIVE: Preg, Serum: NEGATIVE

## 2023-11-14 LAB — CBC
HCT: 37.4 % (ref 36.0–46.0)
Hemoglobin: 11.5 g/dL — ABNORMAL LOW (ref 12.0–15.0)
MCH: 24.4 pg — ABNORMAL LOW (ref 26.0–34.0)
MCHC: 30.7 g/dL (ref 30.0–36.0)
MCV: 79.2 fL — ABNORMAL LOW (ref 80.0–100.0)
Platelets: 318 10*3/uL (ref 150–400)
RBC: 4.72 MIL/uL (ref 3.87–5.11)
RDW: 15.6 % — ABNORMAL HIGH (ref 11.5–15.5)
WBC: 5.8 10*3/uL (ref 4.0–10.5)
nRBC: 0 % (ref 0.0–0.2)

## 2023-11-14 LAB — TROPONIN I (HIGH SENSITIVITY): Troponin I (High Sensitivity): 5 ng/L (ref <18)

## 2023-11-14 MED ORDER — POTASSIUM CHLORIDE CRYS ER 20 MEQ PO TBCR
20.0000 meq | EXTENDED_RELEASE_TABLET | Freq: Two times a day (BID) | ORAL | 0 refills | Status: AC
  Filled 2023-11-14: qty 14, 7d supply, fill #0

## 2023-11-14 MED ORDER — LOSARTAN POTASSIUM 50 MG PO TABS
50.0000 mg | ORAL_TABLET | Freq: Every day | ORAL | 0 refills | Status: AC
  Filled 2023-11-14: qty 30, 30d supply, fill #0

## 2023-11-14 MED ORDER — POTASSIUM CHLORIDE CRYS ER 20 MEQ PO TBCR
40.0000 meq | EXTENDED_RELEASE_TABLET | Freq: Once | ORAL | Status: AC
  Administered 2023-11-14: 40 meq via ORAL
  Filled 2023-11-14: qty 2

## 2023-11-14 NOTE — ED Triage Notes (Signed)
Chest pain since 2 days ago.  Pt states her heart is racing and she feels dizzy.  No known fever. No cough.  Pt states she takes BP medication but cannot get it under control

## 2023-11-14 NOTE — ED Provider Notes (Signed)
[place] [facility] AT [facility] Provider Note  CSN: [id number] Arrival date & time: 11/14/23 1246  Chief Complaint(s) Chest Pain  HPI Brooke Henderson is a 38 y.o. female here today for 2 days of chest pain, feeling unwell, concerns about her blood pressure.   Past Medical History Past Medical History:  Diagnosis Date   Hypertension    Obese    Patient Active Problem List   Diagnosis Date Noted   Obesity affecting pregnancy, antepartum 06/01/2014   Home Medication(s) Prior to Admission medications   Medication Sig Start Date End Date Taking? Authorizing Provider  losartan (COZAAR) 50 MG tablet Take 1 tablet (50 mg total) by mouth daily. 11/14/23 12/14/23 Yes Anders Simmonds T, DO  potassium chloride SA (KLOR-CON M) 20 MEQ tablet Take 1 tablet (20 mEq total) by mouth 2 (two) times daily for 7 days. 11/14/23 11/21/23 Yes Anders Simmonds T, DO  naproxen (NAPROSYN) 500 MG tablet Take 1 tablet (500 mg total) by mouth 2 (two) times daily. 10/18/17   Petrucelli, Pleas Koch, PA-C                                                                                                                                    Past Surgical History Past Surgical History:  Procedure Laterality Date   CESAREAN SECTION     NO PAST SURGERIES     Family History Family History  Problem Relation Age of Onset   Seizures Son    Hypertension Maternal Aunt    Hypertension Paternal Uncle    Diabetes Maternal Grandmother    Diabetes Maternal Grandfather    Diabetes Paternal Grandmother    Diabetes Paternal Grandfather     Social History Social History   Tobacco Use   Smoking status: Never   Smokeless tobacco: Never  Vaping Use   Vaping status: Never Used  Substance Use Topics   Alcohol use: Not Currently   Drug use: No   Allergies Amoxicillin and Penicillins  Review of Systems Review of Systems  Physical Exam Vital Signs  I have reviewed the triage vital signs BP  129/70   Pulse 76   Temp 98.2 F (36.8 C) (Oral)   Resp 15   Ht 5' 5.5" (1.664 m)   Wt (!) 183.3 kg   LMP 11/07/2023   SpO2 98%   BMI 66.21 kg/m   Physical Exam Vitals reviewed.  Constitutional:      Appearance: She is obese.  Cardiovascular:     Heart sounds: Normal heart sounds.  Pulmonary:     Effort: Pulmonary effort is normal.  Musculoskeletal:     Right lower leg: No edema.  Neurological:     General: No focal deficit present.     Mental Status: She is alert.  Psychiatric:        Mood and Affect: Mood normal.     ED Results and Treatments Labs (all labs  ordered are listed, but only abnormal results are displayed) Labs Reviewed  BASIC METABOLIC PANEL - Abnormal; Notable for the following components:      Result Value   Potassium 3.1 (*)    Chloride 97 (*)    Glucose, Bld 102 (*)    All other components within normal limits  CBC - Abnormal; Notable for the following components:   Hemoglobin 11.5 (*)    MCV 79.2 (*)    MCH 24.4 (*)    RDW 15.6 (*)    All other components within normal limits  PREGNANCY, URINE  HCG, SERUM, QUALITATIVE  TROPONIN I (HIGH SENSITIVITY)                                                                                                                          Radiology DG Chest 2 View Result Date: 11/14/2023 CLINICAL DATA:  Chest pain.  Dizziness.  Elevated heart rate. EXAM: CHEST - 2 VIEW COMPARISON:  08/16/2023 FINDINGS: Midline trachea. Borderline cardiomegaly. No pleural effusion or pneumothorax. Clear lungs. No congestive failure. IMPRESSION: No active cardiopulmonary disease. Electronically Signed   By: Jeronimo Greaves M.D.   On: 11/14/2023 13:57    Pertinent labs & imaging results that were available during my care of the patient were reviewed by me and considered in my medical decision making (see MDM for details).  Medications Ordered in ED Medications  potassium chloride SA (KLOR-CON M) CR tablet 40 mEq (has no  administration in time range)                                                                                                                                     Procedures Procedures  (including critical care time)  Medical Decision Making / ED Course   This patient presents to the ED for concern of chest pain, feeling unwell, concerns about her blood pressure, this involves an extensive number of treatment options, and is a complaint that carries with it a high risk of complications and morbidity.  The differential diagnosis includes benign hypertension, hypokalemia, less likely ACS, less likely PE.  MDM: Overall patient looks well.  I reviewed her EKG, normal rhythm, normal intervals, no evidence of acute ischemia.  Patient says that her PCP recently switched her to hydralazine from losartan, he says she is felt unwell since then.  Says she has been taking 15 mg of her  amlodipine to make up for her hydralazine not working.  Patient with hypokalemia, possibly related to the hydralazine.  As patient symptoms seem to occur with hydralazine, will have her stop taking this medicine.  Will restart her on losartan.  Will provide p.o. potassium here.  She is going to follow-up with her PCP.  Negative troponin.  Chest x-ray clear.   Additional history obtained: -Additional history obtained from  -External records from outside source obtained and reviewed including: Chart review including previous notes, labs, imaging, consultation notes   Lab Tests: -I ordered, reviewed, and interpreted labs.   The pertinent results include:   Labs Reviewed  BASIC METABOLIC PANEL - Abnormal; Notable for the following components:      Result Value   Potassium 3.1 (*)    Chloride 97 (*)    Glucose, Bld 102 (*)    All other components within normal limits  CBC - Abnormal; Notable for the following components:   Hemoglobin 11.5 (*)    MCV 79.2 (*)    MCH 24.4 (*)    RDW 15.6 (*)    All other components  within normal limits  PREGNANCY, URINE  HCG, SERUM, QUALITATIVE  TROPONIN I (HIGH SENSITIVITY)      EKG   EKG Interpretation Date/Time:  Friday November 14 2023 12:53:43 EST Ventricular Rate:  96 PR Interval:  151 QRS Duration:  88 QT Interval:  347 QTC Calculation: 439 R Axis:   16  Text Interpretation: Sinus rhythm Borderline T wave abnormalities Confirmed by Anders Simmonds [phone]) on 11/14/2023 3:39:54 PM         Imaging Studies ordered: I ordered imaging studies including x-ray I independently visualized and interpreted imaging. I agree with the radiologist interpretation   Medicines ordered and prescription drug management: Meds ordered this encounter  Medications   potassium chloride SA (KLOR-CON M) CR tablet 40 mEq   potassium chloride SA (KLOR-CON M) 20 MEQ tablet    Sig: Take 1 tablet (20 mEq total) by mouth 2 (two) times daily for 7 days.    Dispense:  14 tablet    Refill:  0   losartan (COZAAR) 50 MG tablet    Sig: Take 1 tablet (50 mg total) by mouth daily.    Dispense:  30 tablet    Refill:  0    -I have reviewed the patients home medicines and have made adjustments as needed   Cardiac Monitoring: The patient was maintained on a cardiac monitor.  I personally viewed and interpreted the cardiac monitored which showed an underlying rhythm of: Normal sinus rhythm  Social Determinants of Health:  Factors impacting patients care include: Obesity   Reevaluation: After the interventions noted above, I reevaluated the patient and found that they have :improved  Co morbidities that complicate the patient evaluation  Past Medical History:  Diagnosis Date   Hypertension    Obese       Dispostion: Discharge with PCP follow-up     Final Clinical Impression(s) / ED Diagnoses Final diagnoses:  Hypokalemia     @PCDICTATION @    Anders Simmonds T, DO 11/14/23 2248

## 2023-11-14 NOTE — Discharge Instructions (Signed)
You can take 50 mg of losartan each day.  You can stop taking your hydralazine.  If your blood pressure remains high on the losartan, you may begin taking 5 mg of amlodipine each day.  Take 2 potassium pills each day for the next 1 week.  Follow-up with your primary care doctor within 2 weeks for repeat blood work.

## 2023-11-14 NOTE — ED Notes (Signed)
Reviewed discharge instructions follow up and medication with pt. Pt states understanding

## 2024-06-04 ENCOUNTER — Emergency Department (HOSPITAL_BASED_OUTPATIENT_CLINIC_OR_DEPARTMENT_OTHER)

## 2024-06-04 ENCOUNTER — Other Ambulatory Visit: Payer: Self-pay

## 2024-06-04 ENCOUNTER — Encounter (HOSPITAL_BASED_OUTPATIENT_CLINIC_OR_DEPARTMENT_OTHER): Payer: Self-pay

## 2024-06-04 ENCOUNTER — Emergency Department (HOSPITAL_BASED_OUTPATIENT_CLINIC_OR_DEPARTMENT_OTHER)
Admission: EM | Admit: 2024-06-04 | Discharge: 2024-06-04 | Disposition: A | Discharge status: Home / Self Care | Attending: Emergency Medicine | Admitting: Emergency Medicine

## 2024-06-04 DIAGNOSIS — W19XXXA Unspecified fall, initial encounter: Secondary | ICD-10-CM | POA: Diagnosis not present

## 2024-06-04 DIAGNOSIS — I10 Essential (primary) hypertension: Secondary | ICD-10-CM | POA: Diagnosis not present

## 2024-06-04 DIAGNOSIS — S40021A Contusion of right upper arm, initial encounter: Secondary | ICD-10-CM | POA: Diagnosis not present

## 2024-06-04 DIAGNOSIS — Y92002 Bathroom of unspecified non-institutional (private) residence single-family (private) house as the place of occurrence of the external cause: Secondary | ICD-10-CM | POA: Insufficient documentation

## 2024-06-04 DIAGNOSIS — S4991XA Unspecified injury of right shoulder and upper arm, initial encounter: Secondary | ICD-10-CM | POA: Diagnosis present

## 2024-06-04 DIAGNOSIS — Z79899 Other long term (current) drug therapy: Secondary | ICD-10-CM | POA: Diagnosis not present

## 2024-06-04 DIAGNOSIS — M7989 Other specified soft tissue disorders: Secondary | ICD-10-CM | POA: Diagnosis not present

## 2024-06-04 NOTE — ED Notes (Signed)
 ED Provider at bedside.

## 2024-06-04 NOTE — ED Triage Notes (Signed)
 Pt presents with complaints of a mechanical fall in the bathroom this am. Reports pain to right shoulder & arm. Report that she hit her head but no LOC. No thinners.

## 2024-06-04 NOTE — Discharge Instructions (Addendum)
 Do not leave your arm in the sling more than a day or two.  You need to take it out of the sling and move it around to prevent your shoulder from becoming stiff.  Make an appointment to follow-up with the orthopedist if your symptoms are not improving.  Return to the emergency room if you have any worsening symptoms.  Your blood pressure was a little bit elevated.  Have this rechecked by your primary care doctor.

## 2024-06-04 NOTE — ED Provider Notes (Signed)
 [place] [facility] Provider Note   CSN: [id number] Arrival date & time: 06/04/24  9192     Patient presents with: [place]   Brooke Henderson is a 38 y.o. female.   Patient is a 38 year old female with a history of hypertension who presents with pain in her right arm after a fall.  She states she slipped in the bathroom and fell over onto her right side this morning.  She does think she hit her head but does not have a headache.  There was no loss of consciousness.  No neck or back pain.  No nausea or vomiting.  She says she only injured her right arm.  She has pain from her shoulder all the way down to her hand.  She denies any numbness or weakness in the hand.  No prior injuries to that arm.       Prior to Admission medications   Medication Sig Start Date End Date Taking? Authorizing Provider  losartan  (COZAAR ) 50 MG tablet Take 1 tablet (50 mg total) by mouth daily. 11/14/23 12/14/23  Mannie Pac T, DO  naproxen  (NAPROSYN ) 500 MG tablet Take 1 tablet (500 mg total) by mouth 2 (two) times daily. 10/18/17   Petrucelli, Samantha R, PA-C  potassium chloride  SA (KLOR-CON  M) 20 MEQ tablet Take 1 tablet (20 mEq total) by mouth 2 (two) times daily for 7 days. 11/14/23 11/21/23  Mannie Pac T, DO    Allergies: Amoxicillin and Penicillins    Review of Systems  Constitutional:  Negative for fever.  Gastrointestinal:  Negative for nausea and vomiting.  Musculoskeletal:  Positive for arthralgias. Negative for back pain, joint swelling and neck pain.  Skin:  Negative for wound.  Neurological:  Negative for weakness, numbness and headaches.    Updated Vital Signs BP (!) 140/83   Pulse 74   Temp 98.4 F (36.9 C)   Resp 18   LMP 05/20/2024 (Approximate)   SpO2 97%   Physical Exam Constitutional:      Appearance: She is well-developed. She is obese.  HENT:     Head: Normocephalic and atraumatic.  Neck:     Comments: No pain along the  spine Cardiovascular:     Rate and Rhythm: Normal rate.  Pulmonary:     Effort: Pulmonary effort is normal.  Musculoskeletal:        General: Tenderness present.     Cervical back: Normal range of motion and neck supple.     Comments: Positive tenderness to the right anterior shoulder.  There is no pain along the cervical spine.  Positive pain on range of motion of the shoulder.  No pain in the mid humerus area.  Positive tenderness in the elbow on palpation as well as the right wrist.  No pain in the mid forearm.  She has normal sensation and motor function to the hand.  Radial pulses intact.  No wounds are noted.  Skin:    General: Skin is warm and dry.  Neurological:     Mental Status: She is alert and oriented to person, place, and time.     (all labs ordered are listed, but only abnormal results are displayed) Labs Reviewed - No data to display  EKG: None  Radiology: DG Wrist Complete Right Result Date: 06/04/2024 CLINICAL DATA:  fall, arm pain. EXAM: RIGHT WRIST - COMPLETE 3+ VIEW COMPARISON:  None Available. FINDINGS: No acute fracture or dislocation. No aggressive osseous lesion. No significant arthritis of the  wrist joint. No radiopaque foreign bodies. Soft tissues are within normal limits. IMPRESSION: No acute osseous abnormality of the right wrist joint. Electronically Signed   By: Ree Molt M.D.   On: 06/04/2024 09:50   DG Elbow Complete Right Result Date: 06/04/2024 CLINICAL DATA:  Fall.  Right arm pain. EXAM: RIGHT ELBOW - COMPLETE 3+ VIEW COMPARISON:  None Available. FINDINGS: No acute fracture or dislocation. No aggressive osseous lesion. No significant elbow joint arthritis. Small spurring along the olecranon process of ulna. No radiopaque foreign bodies. Soft tissues are within normal limits. IMPRESSION: No acute osseous abnormality of the right elbow joint. Electronically Signed   By: Ree Molt M.D.   On: 06/04/2024 09:49   DG Shoulder Right Result Date:  06/04/2024 CLINICAL DATA:  fall, arm pain. EXAM: RIGHT SHOULDER - 2+ VIEW COMPARISON:  None Available. FINDINGS: No acute fracture or dislocation. No aggressive osseous lesion. Glenohumeral and acromioclavicular joints are normal in alignment. No significant degenerative changes. No soft tissue swelling. No radiopaque foreign bodies. IMPRESSION: No acute osseous abnormality of the right shoulder. Electronically Signed   By: Ree Molt M.D.   On: 06/04/2024 09:49     Procedures   Medications Ordered in the ED - No data to display                                  Medical Decision Making Amount and/or Complexity of Data Reviewed Radiology: ordered.   Patient is a 38 year old who presents with pain in her right arm after a fall.  She does not have any clinical concerns for a head injury.  She does not have any spinal injuries.  She had x-rays of her right shoulder, right elbow and right wrist.  These were interpreted by me and confirmed by the radiologist to show no evident fractures or dislocations.  She is neurologically intact.  No radicular symptoms.  Suspect she has an arm contusion.  She was discharged home in good condition.  Was advised on symptomatic care.  Was given a sling per her request.  Although I did advise her that she needs to take it out of the sling periodically and move it around to prevent frozen shoulder.  She was given a referral to follow-up with orthopedist if her symptoms are improving.  Return precautions were given.  Her blood pressure was little bit elevated and she was advised to have this rechecked by her primary care doctor.     Final diagnoses:  Contusion of right upper extremity, initial encounter    ED Discharge Orders     None          Lenor Hollering, MD 06/04/24 1006

## 2024-06-04 NOTE — ED Notes (Signed)
 Pt alert and oriented X 4 at the time of discharge. RR even and unlabored. No acute distress noted. Pt verbalized understanding of discharge instructions as discussed. Pt ambulatory to lobby at time of discharge.

## 2024-06-04 NOTE — ED Notes (Signed)
 Patient transported to X-ray

## 2024-08-10 ENCOUNTER — Encounter (HOSPITAL_BASED_OUTPATIENT_CLINIC_OR_DEPARTMENT_OTHER): Payer: Self-pay | Admitting: Emergency Medicine

## 2024-08-10 ENCOUNTER — Other Ambulatory Visit: Payer: Self-pay

## 2024-08-10 ENCOUNTER — Emergency Department (HOSPITAL_BASED_OUTPATIENT_CLINIC_OR_DEPARTMENT_OTHER)
Admission: EM | Admit: 2024-08-10 | Discharge: 2024-08-10 | Disposition: A | Discharge status: Home / Self Care | Attending: Emergency Medicine | Admitting: Emergency Medicine

## 2024-08-10 DIAGNOSIS — K529 Noninfective gastroenteritis and colitis, unspecified: Secondary | ICD-10-CM | POA: Insufficient documentation

## 2024-08-10 DIAGNOSIS — R110 Nausea: Secondary | ICD-10-CM

## 2024-08-10 DIAGNOSIS — Z79899 Other long term (current) drug therapy: Secondary | ICD-10-CM | POA: Insufficient documentation

## 2024-08-10 DIAGNOSIS — I10 Essential (primary) hypertension: Secondary | ICD-10-CM | POA: Insufficient documentation

## 2024-08-10 DIAGNOSIS — R197 Diarrhea, unspecified: Secondary | ICD-10-CM | POA: Diagnosis present

## 2024-08-10 LAB — RESP PANEL BY RT-PCR (RSV, FLU A&B, COVID)  RVPGX2
Influenza A by PCR: NEGATIVE
Influenza B by PCR: NEGATIVE
Resp Syncytial Virus by PCR: NEGATIVE
SARS Coronavirus 2 by RT PCR: NEGATIVE

## 2024-08-10 MED ORDER — ONDANSETRON 4 MG PO TBDP
4.0000 mg | ORAL_TABLET | Freq: Once | ORAL | Status: AC
  Administered 2024-08-10: 4 mg via ORAL
  Filled 2024-08-10: qty 1

## 2024-08-10 MED ORDER — ONDANSETRON 4 MG PO TBDP: 4.0000 mg | ORAL_TABLET | Freq: Three times a day (TID) | ORAL | 0 refills | Status: AC | PRN

## 2024-08-10 NOTE — Discharge Instructions (Addendum)
You were seen for your viral bug (gastroenteritis) in the emergency department.  At home, please take the Zofran for your nausea and vomiting. Please be sure to stay well-hydrated.  Follow-up with your primary doctor in 2-3 days regarding your visit.  Return immediately to the emergency department if you experience any of the following: fainting, abdominal pain, high fevers, or any other concerning symptoms.  Thank you for visiting our Emergency Department. It was a pleasure taking care of you today.

## 2024-08-10 NOTE — ED Notes (Signed)
 ED Provider at bedside.

## 2024-08-10 NOTE — ED Provider Notes (Signed)
 [facility] Provider Note   CSN: [id number] Arrival date & time: 08/10/24  9354     Patient presents with: Headache   Brooke Henderson is a 38 y.o. female.   38 year old female with a history of hypertension who presents emergency department with vomiting.  Patient reports that she woke up this morning was feeling poorly.  Says that she drank some tea and vomited up afterwards.  No fevers or chills.  No runny nose or sore throat.  Has had some diarrhea as well.  No abdominal pain.  No marijuana use.  No abdominal surgeries other than a C-section.  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 Authorizing Provider  ondansetron (ZOFRAN-ODT) 4 MG disintegrating tablet Take 1 tablet (4 mg total) by mouth every 8 (eight) hours as needed for nausea or vomiting. 08/10/24  Yes Yolande Lamar BROCKS, MD  losartan  (COZAAR ) 50 MG tablet Take 1 tablet (50 mg total) by mouth daily. 11/14/23 12/14/23  Mannie Pac T, DO  naproxen  (NAPROSYN ) 500 MG tablet Take 1 tablet (500 mg total) by mouth 2 (two) times daily. 10/18/17   Petrucelli, Samantha R, PA-C  potassium chloride  SA (KLOR-CON  M) 20 MEQ tablet Take 1 tablet (20 mEq total) by mouth 2 (two) times daily for 7 days. 11/14/23 11/21/23  Mannie Pac DASEN, DO    Allergies: Amoxicillin and Penicillins    Review of Systems  Updated Vital Signs BP (!) 146/91 (BP Location: Right Arm)   Pulse 82   Temp 98.5 F (36.9 C) (Oral)   Resp 18   Ht 5' 6 (1.676 m)   Wt (!) 172.8 kg   LMP 07/20/2024   SpO2 100%   BMI 61.50 kg/m   Physical Exam Vitals and nursing note reviewed.  Constitutional:      General: She is not in acute distress.    Appearance: She is well-developed.  HENT:     Head: Normocephalic and atraumatic.     Right Ear: External ear normal.      Left Ear: External ear normal.     Nose: Nose normal.     Mouth/Throat:     Mouth: Mucous membranes are moist.     Pharynx: Oropharynx is clear.  Eyes:     Extraocular Movements: Extraocular movements intact.     Conjunctiva/sclera: Conjunctivae normal.     Pupils: Pupils are equal, round, and reactive to light.  Cardiovascular:     Rate and Rhythm: Normal rate and regular rhythm.  Pulmonary:     Effort: Pulmonary effort is normal. No respiratory distress.     Breath sounds: Normal breath sounds.  Abdominal:     General: Abdomen is flat. There is no distension.     Palpations: Abdomen is soft. There is no mass.     Tenderness: There is no abdominal tenderness. There is no guarding.  Musculoskeletal:     Cervical back: Normal range of motion and neck supple.  Skin:    General: Skin is warm and dry.  Neurological:     Mental Status: She is alert and oriented to person, place, and time. Mental status is at baseline.  Psychiatric:        Mood and Affect: Mood normal.     (all labs ordered are listed, but only  abnormal results are displayed) Labs Reviewed  RESP PANEL BY RT-PCR (RSV, FLU A&B, COVID)  [id number]    EKG: None  Radiology: No results found.   Procedures   Medications Ordered in the ED  ondansetron (ZOFRAN-ODT) disintegrating tablet 4 mg (4 mg Oral Given 08/10/24 0744)                                    Medical Decision Making Risk Prescription drug management.   Brooke Henderson is a 38 y.o. female with comorbidities that complicate the patient evaluation including hypertension who presents emergency department with vomiting  Initial Ddx:  Gastroenteritis, gastritis, food poisoning, pancreatitis, hyperemesis, appendicitis, cholecystitis, URI  MDM:  Feel the patient likely has gastroenteritis based on their symptoms.  No major risk factors for pancreatitis aside from BMI and is not having any epigastric abdominal pain.  No marijuana use or other risk  factors for cyclical vomiting.  No significant tenderness to palpation in right upper quadrant or right lower quadrant that would suggest cholecystitis or appendicitis.  Plan:  Zofran COVID/flu P.o. challenge  ED Summary/Re-evaluation:  COVID and flu negative.  Patient feeling better after the Zofran.  No additional vomiting.  Says that she does not want to eat or drink and just wants to go home at this point in time.  Given a work note.  Given a prescription for Zofran.  Will have her follow-up with her primary doctor in several days as well  This patient presents to the ED for concern of complaints listed in HPI, this involves an extensive number of treatment options, and is a complaint that carries with it a high risk of complications and morbidity. Disposition including potential need for admission considered.   Dispo: DC Home. Return precautions discussed including, but not limited to, those listed in the AVS. Allowed pt time to ask questions which were answered fully prior to dc.  Records reviewed Outpatient Clinic Notes I have reviewed the patients home medications and made adjustments as needed  Final diagnoses:  Nausea  Gastroenteritis    ED Discharge Orders          Ordered    ondansetron (ZOFRAN-ODT) 4 MG disintegrating tablet  Every 8 hours PRN        08/10/24 0836               Yolande Lamar BROCKS, MD 08/10/24 [phone]

## 2024-08-10 NOTE — ED Triage Notes (Addendum)
 Vomited x 1 this am, h/a with dizziness. States,  I just don't feel good

## 2024-08-10 NOTE — ED Notes (Signed)
 Patient given gingerale for PO challenge.
